# Patient Record
Sex: Male | Born: 1955 | Hispanic: No | Marital: Married | Smoking: Never smoker
Health system: Southern US, Community
[De-identification: ages and names within clinical notes are randomized; demographics above are authoritative.]

## PROBLEM LIST (undated history)

## (undated) DIAGNOSIS — E119 Type 2 diabetes mellitus without complications: Secondary | ICD-10-CM

---

## 2000-01-15 ENCOUNTER — Emergency Department (HOSPITAL_COMMUNITY): Admission: EM | Admit: 2000-01-15 | Discharge: 2000-01-16 | Payer: Self-pay | Admitting: Emergency Medicine

## 2000-01-17 ENCOUNTER — Emergency Department (HOSPITAL_COMMUNITY): Admission: EM | Admit: 2000-01-17 | Discharge: 2000-01-17 | Payer: Self-pay | Admitting: Emergency Medicine

## 2000-01-17 ENCOUNTER — Encounter: Payer: Self-pay | Admitting: Emergency Medicine

## 2000-01-18 ENCOUNTER — Emergency Department (HOSPITAL_COMMUNITY): Admission: EM | Admit: 2000-01-18 | Discharge: 2000-01-18 | Payer: Self-pay | Admitting: Emergency Medicine

## 2000-09-24 ENCOUNTER — Emergency Department (HOSPITAL_COMMUNITY): Admission: EM | Admit: 2000-09-24 | Discharge: 2000-09-24 | Payer: Self-pay | Admitting: Emergency Medicine

## 2001-03-14 ENCOUNTER — Encounter: Payer: Self-pay | Admitting: Emergency Medicine

## 2001-03-14 ENCOUNTER — Emergency Department (HOSPITAL_COMMUNITY): Admission: EM | Admit: 2001-03-14 | Discharge: 2001-03-14 | Payer: Self-pay | Admitting: Emergency Medicine

## 2003-11-23 ENCOUNTER — Ambulatory Visit: Payer: Self-pay | Admitting: Family Medicine

## 2003-12-21 ENCOUNTER — Ambulatory Visit: Payer: Self-pay | Admitting: *Deleted

## 2004-01-04 ENCOUNTER — Ambulatory Visit: Payer: Self-pay | Admitting: Family Medicine

## 2004-04-18 ENCOUNTER — Ambulatory Visit: Payer: Self-pay | Admitting: Family Medicine

## 2004-07-22 ENCOUNTER — Ambulatory Visit: Payer: Self-pay | Admitting: Family Medicine

## 2004-09-23 ENCOUNTER — Ambulatory Visit: Payer: Self-pay | Admitting: Family Medicine

## 2004-11-19 ENCOUNTER — Ambulatory Visit: Payer: Self-pay | Admitting: Family Medicine

## 2004-11-20 ENCOUNTER — Ambulatory Visit: Payer: Self-pay | Admitting: Family Medicine

## 2004-12-20 ENCOUNTER — Ambulatory Visit: Payer: Self-pay | Admitting: Family Medicine

## 2005-03-21 ENCOUNTER — Ambulatory Visit: Payer: Self-pay | Admitting: Family Medicine

## 2005-07-04 ENCOUNTER — Ambulatory Visit: Payer: Self-pay | Admitting: Family Medicine

## 2005-07-25 ENCOUNTER — Ambulatory Visit: Payer: Self-pay | Admitting: Family Medicine

## 2005-09-15 ENCOUNTER — Ambulatory Visit: Payer: Self-pay | Admitting: Family Medicine

## 2005-10-02 ENCOUNTER — Ambulatory Visit (HOSPITAL_COMMUNITY): Admission: RE | Admit: 2005-10-02 | Discharge: 2005-10-02 | Payer: Self-pay | Admitting: Internal Medicine

## 2005-11-24 ENCOUNTER — Ambulatory Visit: Payer: Self-pay | Admitting: Family Medicine

## 2006-03-23 ENCOUNTER — Ambulatory Visit: Payer: Self-pay | Admitting: Family Medicine

## 2006-05-25 ENCOUNTER — Ambulatory Visit: Payer: Self-pay | Admitting: Family Medicine

## 2006-07-13 DIAGNOSIS — R160 Hepatomegaly, not elsewhere classified: Secondary | ICD-10-CM | POA: Insufficient documentation

## 2006-07-13 DIAGNOSIS — E119 Type 2 diabetes mellitus without complications: Secondary | ICD-10-CM

## 2006-08-17 ENCOUNTER — Ambulatory Visit: Payer: Self-pay | Admitting: Internal Medicine

## 2006-09-29 ENCOUNTER — Ambulatory Visit: Payer: Self-pay | Admitting: Internal Medicine

## 2006-09-29 LAB — CONVERTED CEMR LAB
ALT: 126 units/L — ABNORMAL HIGH (ref 0–53)
AST: 63 units/L — ABNORMAL HIGH (ref 0–37)
Albumin: 4.8 g/dL (ref 3.5–5.2)
Alkaline Phosphatase: 100 units/L (ref 39–117)
BUN: 13 mg/dL (ref 6–23)
CO2: 22 meq/L (ref 19–32)
Calcium: 9.5 mg/dL (ref 8.4–10.5)
Chloride: 105 meq/L (ref 96–112)
Cholesterol: 161 mg/dL (ref 0–200)
Creatinine, Ser: 0.71 mg/dL (ref 0.40–1.50)
Glucose, Bld: 211 mg/dL — ABNORMAL HIGH (ref 70–99)
HDL: 33 mg/dL — ABNORMAL LOW (ref >39)
LDL Cholesterol: 101 mg/dL — ABNORMAL HIGH (ref 0–99)
Potassium: 4.1 meq/L (ref 3.5–5.3)
Sodium: 141 meq/L (ref 135–145)
Total Bilirubin: 0.7 mg/dL (ref 0.3–1.2)
Total CHOL/HDL Ratio: 4.9
Total Protein: 7.6 g/dL (ref 6.0–8.3)
Triglycerides: 134 mg/dL (ref <150)
VLDL: 27 mg/dL (ref 0–40)

## 2006-10-21 ENCOUNTER — Encounter (INDEPENDENT_AMBULATORY_CARE_PROVIDER_SITE_OTHER): Payer: Self-pay | Admitting: *Deleted

## 2006-12-14 ENCOUNTER — Ambulatory Visit: Payer: Self-pay | Admitting: Internal Medicine

## 2006-12-14 ENCOUNTER — Encounter (INDEPENDENT_AMBULATORY_CARE_PROVIDER_SITE_OTHER): Payer: Self-pay | Admitting: Family Medicine

## 2006-12-14 LAB — CONVERTED CEMR LAB
AST: 79 units/L — ABNORMAL HIGH (ref 0–37)
Albumin: 4.8 g/dL (ref 3.5–5.2)
Alkaline Phosphatase: 93 units/L (ref 39–117)
BUN: 12 mg/dL (ref 6–23)
Basophils Absolute: 0 10*3/uL (ref 0.0–0.1)
CO2: 23 meq/L (ref 19–32)
Calcium: 9.3 mg/dL (ref 8.4–10.5)
Creatinine, Ser: 0.73 mg/dL (ref 0.40–1.50)
Eosinophils Relative: 4 % (ref 0–5)
HCT: 49.2 % (ref 39.0–52.0)
Hemoglobin: 16.4 g/dL (ref 13.0–17.0)
LDL Cholesterol: 109 mg/dL — ABNORMAL HIGH (ref 0–99)
Lymphocytes Relative: 41 % (ref 12–46)
MCHC: 33.3 g/dL (ref 30.0–36.0)
Monocytes Absolute: 0.4 10*3/uL (ref 0.1–1.0)
RDW: 13.3 % (ref 11.5–15.5)
Total Bilirubin: 0.8 mg/dL (ref 0.3–1.2)
Total Protein: 7.6 g/dL (ref 6.0–8.3)

## 2007-01-08 ENCOUNTER — Ambulatory Visit (HOSPITAL_COMMUNITY): Admission: RE | Admit: 2007-01-08 | Discharge: 2007-01-08 | Payer: Self-pay | Admitting: Cardiology

## 2007-02-08 ENCOUNTER — Ambulatory Visit: Payer: Self-pay | Admitting: Family Medicine

## 2007-02-08 ENCOUNTER — Encounter (INDEPENDENT_AMBULATORY_CARE_PROVIDER_SITE_OTHER): Payer: Self-pay | Admitting: Internal Medicine

## 2007-02-08 LAB — CONVERTED CEMR LAB
ALT: 142 units/L — ABNORMAL HIGH (ref 0–53)
Albumin: 4.6 g/dL (ref 3.5–5.2)
CO2: 21 meq/L (ref 19–32)
Calcium: 9.9 mg/dL (ref 8.4–10.5)
Chloride: 105 meq/L (ref 96–112)
Cholesterol: 123 mg/dL (ref 0–200)
Glucose, Bld: 165 mg/dL — ABNORMAL HIGH (ref 70–99)
Sodium: 140 meq/L (ref 135–145)
Total Protein: 7.4 g/dL (ref 6.0–8.3)

## 2007-05-10 ENCOUNTER — Ambulatory Visit: Payer: Self-pay | Admitting: Internal Medicine

## 2007-05-10 ENCOUNTER — Encounter (INDEPENDENT_AMBULATORY_CARE_PROVIDER_SITE_OTHER): Payer: Self-pay | Admitting: Family Medicine

## 2007-05-10 LAB — CONVERTED CEMR LAB
ALT: 109 units/L — ABNORMAL HIGH (ref 0–53)
AST: 61 units/L — ABNORMAL HIGH (ref 0–37)
Alkaline Phosphatase: 91 units/L (ref 39–117)
Glucose, Bld: 160 mg/dL — ABNORMAL HIGH (ref 70–99)
HCV Ab: NEGATIVE
Hep A Total Ab: POSITIVE — AB
Hep B E Ab: NEGATIVE
Hep B S Ab: NEGATIVE
Sodium: 140 meq/L (ref 135–145)
Total Bilirubin: 0.8 mg/dL (ref 0.3–1.2)
Total Protein: 7.7 g/dL (ref 6.0–8.3)

## 2007-08-26 ENCOUNTER — Ambulatory Visit: Payer: Self-pay | Admitting: Family Medicine

## 2007-09-16 ENCOUNTER — Ambulatory Visit: Payer: Self-pay | Admitting: Internal Medicine

## 2007-09-16 LAB — CONVERTED CEMR LAB
Cholesterol: 122 mg/dL (ref 0–200)
Hep B E Ab: NEGATIVE
Hep B S Ab: NEGATIVE
Microalb, Ur: 1.98 mg/dL — ABNORMAL HIGH (ref 0.00–1.89)
Total CHOL/HDL Ratio: 3.7

## 2007-11-11 ENCOUNTER — Ambulatory Visit: Payer: Self-pay | Admitting: Internal Medicine

## 2008-02-03 ENCOUNTER — Ambulatory Visit: Payer: Self-pay | Admitting: Internal Medicine

## 2008-07-19 ENCOUNTER — Ambulatory Visit: Payer: Self-pay | Admitting: Internal Medicine

## 2008-07-19 ENCOUNTER — Ambulatory Visit: Payer: Self-pay | Admitting: Family Medicine

## 2008-12-22 ENCOUNTER — Encounter (INDEPENDENT_AMBULATORY_CARE_PROVIDER_SITE_OTHER): Payer: Self-pay | Admitting: Adult Health

## 2008-12-22 ENCOUNTER — Ambulatory Visit: Payer: Self-pay | Admitting: Family Medicine

## 2008-12-22 LAB — CONVERTED CEMR LAB
ALT: 174 units/L — ABNORMAL HIGH (ref 0–53)
CO2: 24 meq/L (ref 19–32)
Calcium: 9.8 mg/dL (ref 8.4–10.5)
Chloride: 105 meq/L (ref 96–112)
Cholesterol: 136 mg/dL (ref 0–200)
Creatinine, Ser: 0.72 mg/dL (ref 0.40–1.50)
Microalb, Ur: 1.79 mg/dL (ref 0.00–1.89)
Total Protein: 7.8 g/dL (ref 6.0–8.3)

## 2009-01-19 ENCOUNTER — Encounter (INDEPENDENT_AMBULATORY_CARE_PROVIDER_SITE_OTHER): Payer: Self-pay | Admitting: Adult Health

## 2009-01-19 ENCOUNTER — Ambulatory Visit: Payer: Self-pay | Admitting: Internal Medicine

## 2009-01-19 LAB — CONVERTED CEMR LAB: Hgb A1c MFr Bld: 9.5 % — ABNORMAL HIGH (ref 4.6–6.1)

## 2009-04-20 ENCOUNTER — Encounter (INDEPENDENT_AMBULATORY_CARE_PROVIDER_SITE_OTHER): Payer: Self-pay | Admitting: Adult Health

## 2009-04-20 ENCOUNTER — Ambulatory Visit: Payer: Self-pay | Admitting: Internal Medicine

## 2009-04-20 LAB — CONVERTED CEMR LAB
ALT: 109 units/L — ABNORMAL HIGH (ref 0–53)
AST: 67 units/L — ABNORMAL HIGH (ref 0–37)
Albumin: 4.9 g/dL (ref 3.5–5.2)
Calcium: 9.9 mg/dL (ref 8.4–10.5)
Chloride: 102 meq/L (ref 96–112)
Helicobacter Pylori Antibody-IgG: 5.3 — ABNORMAL HIGH
Potassium: 4.2 meq/L (ref 3.5–5.3)
Sodium: 140 meq/L (ref 135–145)

## 2009-05-28 ENCOUNTER — Ambulatory Visit: Payer: Self-pay | Admitting: Internal Medicine

## 2009-06-15 ENCOUNTER — Ambulatory Visit: Payer: Self-pay | Admitting: Internal Medicine

## 2009-06-25 ENCOUNTER — Ambulatory Visit: Payer: Self-pay | Admitting: Internal Medicine

## 2010-01-18 ENCOUNTER — Encounter (INDEPENDENT_AMBULATORY_CARE_PROVIDER_SITE_OTHER): Payer: Self-pay | Admitting: *Deleted

## 2010-01-18 LAB — CONVERTED CEMR LAB
ALT: 105 units/L — ABNORMAL HIGH (ref 0–53)
AST: 61 units/L — ABNORMAL HIGH (ref 0–37)
CO2: 25 meq/L (ref 19–32)
Calcium: 9.6 mg/dL (ref 8.4–10.5)
Chloride: 105 meq/L (ref 96–112)
Microalb, Ur: 0.5 mg/dL (ref 0.00–1.89)
Sodium: 140 meq/L (ref 135–145)
Total Bilirubin: 0.4 mg/dL (ref 0.3–1.2)
Total Protein: 7.7 g/dL (ref 6.0–8.3)

## 2010-09-27 ENCOUNTER — Inpatient Hospital Stay (INDEPENDENT_AMBULATORY_CARE_PROVIDER_SITE_OTHER)
Admission: RE | Admit: 2010-09-27 | Discharge: 2010-09-27 | Disposition: A | Discharge status: Home / Self Care | Payer: Self-pay | Source: Ambulatory Visit | Attending: Family Medicine | Admitting: Family Medicine

## 2010-09-27 DIAGNOSIS — B029 Zoster without complications: Secondary | ICD-10-CM

## 2010-10-14 ENCOUNTER — Inpatient Hospital Stay (INDEPENDENT_AMBULATORY_CARE_PROVIDER_SITE_OTHER)
Admission: RE | Admit: 2010-10-14 | Discharge: 2010-10-14 | Disposition: A | Discharge status: Home / Self Care | Payer: Self-pay | Source: Ambulatory Visit | Attending: Emergency Medicine | Admitting: Emergency Medicine

## 2010-10-14 DIAGNOSIS — B029 Zoster without complications: Secondary | ICD-10-CM

## 2011-10-12 ENCOUNTER — Emergency Department: Payer: Self-pay | Admitting: *Deleted

## 2011-10-12 LAB — COMPREHENSIVE METABOLIC PANEL
Anion Gap: 8 (ref 7–16)
BUN: 31 mg/dL — ABNORMAL HIGH (ref 7–18)
Bilirubin,Total: 0.6 mg/dL (ref 0.2–1.0)
Creatinine: 1.12 mg/dL (ref 0.60–1.30)
EGFR (African American): >60
EGFR (Non-African Amer.): >60
Glucose: 243 mg/dL — ABNORMAL HIGH (ref 65–99)
SGOT(AST): 140 U/L — ABNORMAL HIGH (ref 15–37)
SGPT (ALT): 169 U/L — ABNORMAL HIGH (ref 12–78)
Sodium: 137 mmol/L (ref 136–145)

## 2011-10-12 LAB — CBC
HGB: 14.9 g/dL (ref 13.0–18.0)
MCHC: 34.2 g/dL (ref 32.0–36.0)
Platelet: 192 10*3/uL (ref 150–440)
RDW: 13.6 % (ref 11.5–14.5)

## 2011-10-13 LAB — URINALYSIS, COMPLETE
Bacteria: NONE SEEN
Bilirubin,UR: NEGATIVE
Blood: NEGATIVE
Ketone: NEGATIVE
Leukocyte Esterase: NEGATIVE
Nitrite: NEGATIVE
Ph: 5 (ref 4.5–8.0)
Protein: 30
Specific Gravity: 1.029 (ref 1.003–1.030)

## 2017-12-20 ENCOUNTER — Other Ambulatory Visit: Payer: Self-pay

## 2017-12-20 ENCOUNTER — Emergency Department
Admission: EM | Admit: 2017-12-20 | Discharge: 2017-12-20 | Disposition: A | Discharge status: Home / Self Care | Payer: Self-pay | Attending: Emergency Medicine | Admitting: Emergency Medicine

## 2017-12-20 ENCOUNTER — Encounter: Payer: Self-pay | Admitting: Intensive Care

## 2017-12-20 DIAGNOSIS — R21 Rash and other nonspecific skin eruption: Secondary | ICD-10-CM | POA: Insufficient documentation

## 2017-12-20 DIAGNOSIS — K047 Periapical abscess without sinus: Secondary | ICD-10-CM | POA: Insufficient documentation

## 2017-12-20 DIAGNOSIS — E119 Type 2 diabetes mellitus without complications: Secondary | ICD-10-CM | POA: Insufficient documentation

## 2017-12-20 HISTORY — DX: Type 2 diabetes mellitus without complications: E11.9

## 2017-12-20 MED ORDER — RANITIDINE HCL 150 MG PO TABS: 150.0000 mg | ORAL_TABLET | Freq: Two times a day (BID) | ORAL | 0 refills | Status: AC

## 2017-12-20 MED ORDER — AMOXICILLIN 500 MG PO CAPS
500.0000 mg | ORAL_CAPSULE | Freq: Once | ORAL | Status: AC
Start: 2017-12-20 — End: 2017-12-20
  Administered 2017-12-20: 500 mg via ORAL
  Filled 2017-12-20: qty 1

## 2017-12-20 MED ORDER — FAMOTIDINE 20 MG PO TABS
20.0000 mg | ORAL_TABLET | Freq: Once | ORAL | Status: AC
  Administered 2017-12-20: 20 mg via ORAL
  Filled 2017-12-20: qty 1

## 2017-12-20 MED ORDER — AMOXICILLIN 500 MG PO CAPS: 500.0000 mg | ORAL_CAPSULE | Freq: Three times a day (TID) | ORAL | 0 refills | Status: AC

## 2017-12-20 NOTE — ED Triage Notes (Signed)
Patient reports rash all over body and Right sided face pain X1 week. Denies fevers at home. Afebrile upon arrival. No open wounds noted

## 2017-12-20 NOTE — Discharge Instructions (Signed)
Your exam is consistent with a dental abscess, that will be treated with antibiotics. You will have the skin rash, that is not present today, treated with medicine for itch relief. Take OTC Benadryl (diphenhydramine) at night for itch relief. Use the surgical soap daily for local skin irritation. Follow-up with your provider as needed.

## 2017-12-20 NOTE — ED Notes (Signed)
Patient c/o blister like rash all over body X 1 week and right sided face pain.

## 2017-12-20 NOTE — ED Provider Notes (Signed)
[facility] Emergency Department Provider Note ____________________________________________  Time seen: 1550  I have reviewed the triage vital signs and the nursing notes.  HISTORY  Chief Complaint  Rash  HPI Michael Huynh is a 62 y.o. male who presents himself to the ED for evaluation of a rash all over his body for the last week.  Patient describes a blisterlike rash, described as "bubbles" over his torso and legs. He is not aware of any allergies, bites, or irritant exposures. He would describe itching with the blisters, he scratches them, they scab over, and  resolves. He has noted the rash, sporadically for the last week.  He also notes some pain to the right side of his face & jaw.  He denies any interim fevers at home.  He denies any known contacts, exposures, or travel.  His medical history is significant for diabetes and hepatomegaly.  Past Medical History:  Diagnosis Date  . Diabetes mellitus without complication [facility])     Patient Active Problem List   Diagnosis Date Noted  . DM 07/13/2006  . 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 No pertinent surgical history.  Prior to Admission medications   Medication Sig Start Date End Date Taking? Authorizing Provider  amoxicillin (AMOXIL) 500 MG capsule Take 1 capsule (500 mg total) by mouth 3 (three) times daily. 12/20/17   Halee Glynn, Charlesetta Ivory, PA-C  ranitidine (ZANTAC) 150 MG tablet Take 1 tablet (150 mg total) by mouth 2 (two) times daily. 12/20/17   Merline Perkin, Charlesetta Ivory, PA-C    Allergies Patient has no known allergies.  History reviewed. No pertinent family history.  Social History Social History   Tobacco Use  . Smoking status: Never Smoker  . Smokeless tobacco: Never Used  Substance Use Topics  . Alcohol use: Not Currently  . Drug use: Never    Review of Systems  Constitutional: Negative for fever. Eyes: Negative for visual changes. ENT: Negative for sore throat.  Reports right jaw/facial pain Cardiovascular: Negative for chest pain. Respiratory: Negative for shortness of breath. Gastrointestinal: Negative for abdominal pain, vomiting and diarrhea. Genitourinary: Negative for dysuria. Musculoskeletal: Negative for back pain. Skin: Positive for rash. Neurological: Negative for headaches, focal weakness or numbness. ____________________________________________  PHYSICAL EXAM:  VITAL SIGNS: ED Triage Vitals  Enc Vitals Group     BP 12/20/17 1441 133/66     Pulse Rate 12/20/17 1441 76     Resp 12/20/17 1441 16     Temp 12/20/17 1441 98.6 F (37 C)     Temp Source 12/20/17 1441 Oral     SpO2 12/20/17 1441 97 %     Weight 12/20/17 1442 157 lb (71.2 kg)     Height 12/20/17 1442 5\' 3"  (1.6 m)     Head Circumference --      Peak Flow --      Pain Score 12/20/17 1442 7     Pain Loc --      Pain Edu? --      Excl. in GC? --     Constitutional: Alert and oriented. Well appearing and in no distress. Head: Normocephalic and atraumatic. Eyes: Conjunctivae are normal. PERRL. Normal extraocular movements Ears: Canals clear. TMs intact bilaterally. Nose: No congestion/rhinorrhea/epistaxis. Mouth/Throat: Mucous membranes are moist.  Uvula is midline and tonsils are flat.  No oropharyngeal lesions are appreciated.  Patient with tenderness localized to the right upper molar.  There is some focal gum swelling and irritation noted at that region.  No brawny sublingual edema is appreciated. Neck: Supple. No thyromegaly. Hematological/Lymphatic/Immunological: No cervical lymphadenopathy. Cardiovascular: Normal rate, regular rhythm. Normal distal pulses. Respiratory: Normal respiratory effort. No wheezes/rales/rhonchi. Skin:  Skin is warm, dry and intact. No rash noted.  Patient with no obvious skin changes on exam.  He points out several areas where he is excoriated the skin primarily to the upper shoulders and trapezius.  There is no blister formation,  vesicles, erythema, edema, scaling or eczema noted.  No warmth or induration is appreciated. ____________________________________________  PROCEDURES  Procedures Famotidine 20 mg PO Amoxicillin 500 mg PO ____________________________________________  INITIAL IMPRESSION / ASSESSMENT AND PLAN / ED COURSE  Patient with ED evaluation of complaint of rash.  Patient's exam is benign at this time as he has no active skin lesions.  There is evidence of some excoriations to what he describes as itchy blisters.  No indication of any secondary skin infection noted.  Patient secondary complaint is right jaw pain which is likely due to some early dental abscess.  We will treat with amoxicillin for dental abscess prevention.  He will also be treated with anti-histamines for itch relief.  A sample of chlorhexidine soap is provided for the patient to use to prevent any secondary infection from scratching.  He will follow-up with primary provider or return to the ED as needed. ____________________________________________  FINAL CLINICAL IMPRESSION(S) / ED DIAGNOSES  Final diagnoses:  Rash and nonspecific skin eruption  Dental abscess      Lissa HoardMenshew, Aarit Kashuba V Bacon, PA-C 12/22/17 1058    Phineas SemenGoodman, Graydon, MD 12/24/17 1131

## 2018-01-07 ENCOUNTER — Encounter: Payer: Self-pay | Admitting: *Deleted

## 2018-01-07 ENCOUNTER — Other Ambulatory Visit: Payer: Self-pay

## 2018-01-07 DIAGNOSIS — E119 Type 2 diabetes mellitus without complications: Secondary | ICD-10-CM | POA: Insufficient documentation

## 2018-01-07 DIAGNOSIS — L03031 Cellulitis of right toe: Secondary | ICD-10-CM | POA: Insufficient documentation

## 2018-01-07 LAB — BASIC METABOLIC PANEL
Anion gap: 7 (ref 5–15)
BUN: 16 mg/dL (ref 8–23)
CHLORIDE: 102 mmol/L (ref 98–111)
CO2: 25 mmol/L (ref 22–32)
CREATININE: 0.79 mg/dL (ref 0.61–1.24)
Calcium: 9.1 mg/dL (ref 8.9–10.3)
GFR calc Af Amer: >60 mL/min (ref >60)
GFR calc non Af Amer: >60 mL/min (ref >60)
Glucose, Bld: 357 mg/dL — ABNORMAL HIGH (ref 70–99)
POTASSIUM: 4.2 mmol/L (ref 3.5–5.1)
Sodium: 134 mmol/L — ABNORMAL LOW (ref 135–145)

## 2018-01-07 LAB — CBC
HEMATOCRIT: 39.6 % (ref 39.0–52.0)
HEMOGLOBIN: 13.5 g/dL (ref 13.0–17.0)
MCH: 30.1 pg (ref 26.0–34.0)
MCHC: 34.1 g/dL (ref 30.0–36.0)
MCV: 88.4 fL (ref 80.0–100.0)
Platelets: 216 10*3/uL (ref 150–400)
RBC: 4.48 MIL/uL (ref 4.22–5.81)
RDW: 12.3 % (ref 11.5–15.5)
WBC: 10.1 10*3/uL (ref 4.0–10.5)
nRBC: 0 % (ref 0.0–0.2)

## 2018-01-07 NOTE — ED Triage Notes (Signed)
Pt is diabetic.  Pt has pain and swelling to right great toe.  Sx began yesterday.  Pt alert

## 2018-01-08 ENCOUNTER — Emergency Department: Payer: Self-pay

## 2018-01-08 ENCOUNTER — Emergency Department
Admission: EM | Admit: 2018-01-08 | Discharge: 2018-01-08 | Disposition: A | Discharge status: Home / Self Care | Payer: Self-pay | Attending: Emergency Medicine | Admitting: Emergency Medicine

## 2018-01-08 DIAGNOSIS — L03031 Cellulitis of right toe: Secondary | ICD-10-CM

## 2018-01-08 MED ORDER — LIDOCAINE-PRILOCAINE 2.5-2.5 % EX CREA
TOPICAL_CREAM | Freq: Once | CUTANEOUS | Status: AC
  Administered 2018-01-08: 1 via TOPICAL
  Filled 2018-01-08: qty 5

## 2018-01-08 MED ORDER — DOXYCYCLINE HYCLATE 100 MG PO TABS
100.0000 mg | ORAL_TABLET | Freq: Once | ORAL | Status: AC
  Administered 2018-01-08: 100 mg via ORAL
  Filled 2018-01-08: qty 1

## 2018-01-08 MED ORDER — DOXYCYCLINE HYCLATE 100 MG PO CAPS: ORAL_CAPSULE | ORAL | 0 refills | Status: AC

## 2018-01-08 NOTE — ED Provider Notes (Signed)
[facility] Emergency Department Provider Note  ____________________________________________   First MD Initiated Contact with Patient 01/08/18 0045     (approximate)  I have reviewed the triage vital signs and the nursing notes.   HISTORY  Chief Complaint Toe Pain    HPI Michael Huynh is a 62 y.o. male with DM on oral medications who presents for evaluation of gradually worsening pain and swelling of his right great toe around the nail over the last 24 hours.  He did not sustain any injury of which he is aware.  He wears boots all day for work but they have not changed recently.  He says that it is red, painful, and swollen and it started at the corner of the nail and is spread throughout the toe.  It does not extend into his foot.  He describes the pain is severe and stepping on it or pushing it makes it worse, nothing in particular makes it better.  It is an aching and sharp pain.  He denies fever/chills, chest pain, shortness of breath, nausea, vomiting, and abdominal pain.   Past Medical History:  Diagnosis Date  . Diabetes mellitus without complication [facility])     Patient Active Problem List   Diagnosis Date Noted  . DM 07/13/2006  . HEPATOMEGALY 07/13/2006    No past surgical history on file.  Prior to Admission medications   Medication Sig Start Date End Date Taking? Authorizing Provider  amoxicillin (AMOXIL) 500 MG capsule Take 1 capsule (500 mg total) by mouth 3 (three) times daily. 12/20/17   Menshew, Charlesetta Ivory, PA-C  doxycycline (VIBRAMYCIN) 100 MG capsule Take 1 capsule (100 mg) by mouth twice daily for 10 days. 01/08/18   Loleta Rose, MD  ranitidine (ZANTAC) 150 MG tablet Take 1 tablet (150 mg total) by mouth 2 (two) times daily. 12/20/17   Menshew, Charlesetta Ivory, PA-C    Allergies Patient has no known allergies.  No family history on file.  Social History Social History   Tobacco Use  . Smoking status: Never Smoker  .  Smokeless tobacco: Never Used  Substance Use Topics  . Alcohol use: Not Currently  . Drug use: Never    Review of Systems Constitutional: No fever/chills Eyes: No visual changes. ENT: No sore throat. Cardiovascular: Denies chest pain. Respiratory: Denies shortness of breath. Gastrointestinal: No abdominal pain.  No nausea, no vomiting.  No diarrhea.  No constipation. Genitourinary: Negative for dysuria. Musculoskeletal: Pain in right great toe as described above.  Negative for neck pain.  Negative for back pain. Integumentary: Negative for rash. Neurological: Negative for headaches, focal weakness or numbness.   ____________________________________________   PHYSICAL EXAM:  VITAL SIGNS: ED Triage Vitals  Enc Vitals Group     BP 01/07/18 2007 130/61     Pulse Rate 01/07/18 2007 77     Resp 01/07/18 2007 20     Temp 01/07/18 2007 99.8 F (37.7 C)     Temp Source 01/07/18 2007 Oral     SpO2 01/07/18 2007 99 %     Weight 01/07/18 2009 71.2 kg (157 lb)     Height 01/07/18 2009 1.6 m (5\' 3" )     Head Circumference --      Peak Flow --      Pain Score 01/07/18 2008 10     Pain Loc --      Pain Edu? --      Excl. in GC? --  Constitutional: Alert and oriented. Well appearing and in no acute distress. Eyes: Conjunctivae are normal.  Head: Atraumatic. Nose: No congestion/rhinnorhea. Mouth/Throat: Mucous membranes are moist. Neck: No stridor.  No meningeal signs.   Cardiovascular: Normal rate, regular rhythm. Good peripheral circulation. Grossly normal heart sounds. Respiratory: Normal respiratory effort.  No retractions. Lungs CTAB. Gastrointestinal: Soft and nontender. No distention.  Musculoskeletal: Erythema, fluctuance, and tenderness all around the base of the right toenail most consistent with a paronychia.  His toenail has some chronic deformity and he has a little bit of serosanguineous discharge around the base of the nail.  The patient has no diabetic foot wound  and the plantar surface of the toe and foot are normal.  There is no spreading cellulitis beyond the base of the toe. Neurologic:  Normal speech and language. No gross focal neurologic deficits are appreciated.  Skin:  Skin is warm, dry and intact. No rash noted. Psychiatric: Mood and affect are normal. Speech and behavior are normal.  ____________________________________________   LABS (all labs ordered are listed, but only abnormal results are displayed)  Labs Reviewed  BASIC METABOLIC PANEL - Abnormal; Notable for the following components:      Result Value   Sodium 134 (*)    Glucose, Bld 357 (*)    All other components within normal limits  AEROBIC/ANAEROBIC CULTURE (SURGICAL/DEEP WOUND)  CBC   ____________________________________________  EKG  No indication for EKG ____________________________________________  RADIOLOGY Marylou MccoyI, Chasyn Cinque, personally viewed and evaluated these images (plain radiographs) as part of my medical decision making, as well as reviewing the written report by the radiologist.  ED MD interpretation: No bony abnormality including no osteomyelitis  Official radiology report(s): Dg Toe Great Right  Result Date: 01/08/2018 CLINICAL DATA:  Pain, swelling.  Diabetic. EXAM: RIGHT GREAT TOE COMPARISON:  None FINDINGS: No acute bony abnormality. Specifically, no fracture, subluxation, or dislocation. Soft tissue swelling in the right great toe. No radiographic changes of osteomyelitis. IMPRESSION: Soft tissue swelling.  No acute bony abnormality. Electronically Signed   By: Charlett NoseKevin  Dover M.D.   On: 01/08/2018 00:49    ____________________________________________   PROCEDURES  Critical Care performed: No   Procedure(s) performed:   Marland Kitchen.Marland Kitchen.Incision and Drainage Date/Time: 01/08/2018 2:41 AM Performed by: Loleta RoseForbach, Brax Walen, MD Authorized by: Loleta RoseForbach, Starlette Thurow, MD   Consent:    Consent obtained:  Verbal   Consent given by:  Patient   Risks discussed:  Bleeding,  infection, incomplete drainage and pain   Alternatives discussed:  Alternative treatment, delayed treatment and observation Location:    Indications for incision and drainage: Paronychia.   Location:  Lower extremity   Lower extremity location:  Toe   Toe location:  R big toe Pre-procedure details:    Skin preparation:  Betadine Anesthesia (see MAR for exact dosages):    Anesthesia method:  Topical application   Topical anesthetic:  EMLA cream Procedure type:    Complexity:  Simple Procedure details:    Incision types:  Single straight   Scalpel blade:  11   Drainage:  Bloody and purulent   Drainage amount:  Copious   Wound treatment:  Wound left open Post-procedure details:    Patient tolerance of procedure:  Tolerated well, no immediate complications     ____________________________________________   INITIAL IMPRESSION / ASSESSMENT AND PLAN / ED COURSE  As part of my medical decision making, I reviewed the following data within the electronic MEDICAL RECORD NUMBER Nursing notes reviewed and incorporated, Labs reviewed , Radiograph  reviewed  and Notes from prior ED visits    Differential diagnosis includes, but is not limited to, paronychia, ingrown toenail, cellulitis, diabetic foot ulcer.  His lab results are within normal limits with no leukocytosis, notable only for hyperglycemia of which she is aware.  Vital signs are stable.  No signs of sepsis.  He has an obvious paronychia of the right great toe and as documented above I drained it with copious bloody purulent discharge.  I stressed to him and his wife the importance of close outpatient follow-up with podiatry, keeping the wound clean and dry, etc.  I gave my usual customary return precautions and provided a course of doxycycline including a coupon from [email or web address].  They understand and agree with the plan.     ____________________________________________  FINAL CLINICAL IMPRESSION(S) / ED DIAGNOSES  Final diagnoses:    Paronychia of great toe, right     MEDICATIONS GIVEN DURING THIS VISIT:  Medications  lidocaine-prilocaine (EMLA) cream (1 application Topical Given 01/08/18 0124)  doxycycline (VIBRA-TABS) tablet 100 mg (100 mg Oral Given 01/08/18 6962)     ED Discharge Orders         Ordered    doxycycline (VIBRAMYCIN) 100 MG capsule     01/08/18 0225           Note:  This document was prepared using Dragon voice recognition software and may include unintentional dictation errors.    Loleta Rose, MD 01/08/18 [id number]

## 2018-01-08 NOTE — ED Notes (Signed)
Dr York CeriseForbach in for I&D of right great toe
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
## 2018-01-08 NOTE — ED Notes (Signed)
Toe clensed thoroughly with NS and large bandaid applied

## 2018-01-08 NOTE — Discharge Instructions (Signed)
Please soak your toe in warm water twice daily.  Keep the wound clean and dry.  Take your antibiotics as instructed for the next 10 days.  It is very important that you follow up with a foot specialist (podiatrist) such as Dr. Alberteen Spindleline or one of his colleagues for a follow up appointment.  Return to the emergency department if you develop new or worsening symptoms that concern you.

## 2018-01-13 LAB — AEROBIC/ANAEROBIC CULTURE W GRAM STAIN (SURGICAL/DEEP WOUND): Special Requests: NORMAL

## 2018-04-07 ENCOUNTER — Emergency Department: Payer: Self-pay

## 2018-04-07 ENCOUNTER — Emergency Department
Admission: EM | Admit: 2018-04-07 | Discharge: 2018-04-07 | Disposition: A | Discharge status: Home / Self Care | Payer: Self-pay | Attending: Student in an Organized Health Care Education/Training Program | Admitting: Student in an Organized Health Care Education/Training Program

## 2018-04-07 ENCOUNTER — Other Ambulatory Visit: Payer: Self-pay

## 2018-04-07 DIAGNOSIS — J069 Acute upper respiratory infection, unspecified: Secondary | ICD-10-CM | POA: Insufficient documentation

## 2018-04-07 DIAGNOSIS — R1013 Epigastric pain: Secondary | ICD-10-CM | POA: Insufficient documentation

## 2018-04-07 DIAGNOSIS — E119 Type 2 diabetes mellitus without complications: Secondary | ICD-10-CM | POA: Insufficient documentation

## 2018-04-07 DIAGNOSIS — J101 Influenza due to other identified influenza virus with other respiratory manifestations: Secondary | ICD-10-CM | POA: Insufficient documentation

## 2018-04-07 LAB — URINALYSIS, COMPLETE (UACMP) WITH MICROSCOPIC
BILIRUBIN URINE: NEGATIVE
Bacteria, UA: NONE SEEN
Glucose, UA: >=500 mg/dL — AB
Ketones, ur: 20 mg/dL — AB
LEUKOCYTE UA: NEGATIVE
Nitrite: NEGATIVE
Protein, ur: 30 mg/dL — AB
Specific Gravity, Urine: 1.02 (ref 1.005–1.030)
Squamous Epithelial / HPF: NONE SEEN (ref 0–5)
pH: 5 (ref 5.0–8.0)

## 2018-04-07 LAB — COMPREHENSIVE METABOLIC PANEL
ALBUMIN: 3.8 g/dL (ref 3.5–5.0)
ALT: 28 U/L (ref 0–44)
AST: 33 U/L (ref 15–41)
Alkaline Phosphatase: 71 U/L (ref 38–126)
Anion gap: 14 (ref 5–15)
BUN: 25 mg/dL — ABNORMAL HIGH (ref 8–23)
CO2: 20 mmol/L — ABNORMAL LOW (ref 22–32)
Calcium: 9 mg/dL (ref 8.9–10.3)
Chloride: 98 mmol/L (ref 98–111)
Creatinine, Ser: 0.96 mg/dL (ref 0.61–1.24)
GFR calc Af Amer: >60 mL/min (ref >60)
GFR calc non Af Amer: >60 mL/min (ref >60)
Glucose, Bld: 277 mg/dL — ABNORMAL HIGH (ref 70–99)
POTASSIUM: 4.1 mmol/L (ref 3.5–5.1)
Sodium: 132 mmol/L — ABNORMAL LOW (ref 135–145)
Total Bilirubin: 1.1 mg/dL (ref 0.3–1.2)
Total Protein: 7.9 g/dL (ref 6.5–8.1)

## 2018-04-07 LAB — CBC
HCT: 37.4 % — ABNORMAL LOW (ref 39.0–52.0)
Hemoglobin: 12.8 g/dL — ABNORMAL LOW (ref 13.0–17.0)
MCH: 29.4 pg (ref 26.0–34.0)
MCHC: 34.2 g/dL (ref 30.0–36.0)
MCV: 86 fL (ref 80.0–100.0)
Platelets: 220 10*3/uL (ref 150–400)
RBC: 4.35 MIL/uL (ref 4.22–5.81)
RDW: 12 % (ref 11.5–15.5)
WBC: 7.8 10*3/uL (ref 4.0–10.5)
nRBC: 0 % (ref 0.0–0.2)

## 2018-04-07 LAB — LIPASE, BLOOD: Lipase: 21 U/L (ref 11–51)

## 2018-04-07 LAB — INFLUENZA PANEL BY PCR (TYPE A & B)
Influenza A By PCR: POSITIVE — AB
Influenza B By PCR: NEGATIVE

## 2018-04-07 MED ORDER — NAPROXEN 500 MG PO TABS: 500.0000 mg | ORAL_TABLET | Freq: Once | ORAL | Status: DC

## 2018-04-07 MED ORDER — IPRATROPIUM-ALBUTEROL 0.5-2.5 (3) MG/3ML IN SOLN
3.0000 mL | Freq: Once | RESPIRATORY_TRACT | Status: AC
  Administered 2018-04-07: 3 mL via RESPIRATORY_TRACT
  Filled 2018-04-07: qty 3

## 2018-04-07 MED ORDER — ALBUTEROL SULFATE HFA 108 (90 BASE) MCG/ACT IN AERS: 2.0000 | INHALATION_SPRAY | Freq: Four times a day (QID) | RESPIRATORY_TRACT | 2 refills | Status: AC | PRN

## 2018-04-07 MED ORDER — OSELTAMIVIR PHOSPHATE 75 MG PO CAPS: 75.0000 mg | ORAL_CAPSULE | Freq: Two times a day (BID) | ORAL | 0 refills | Status: AC

## 2018-04-07 NOTE — ED Notes (Signed)
pulse ox maintains 96% with ambulation

## 2018-04-07 NOTE — ED Notes (Signed)
Tolerated fluids well.

## 2018-04-07 NOTE — ED Triage Notes (Signed)
Pt in with co upper abd pain and cough since Saturday. Denies any n.v.d or dysuria.

## 2018-04-07 NOTE — ED Provider Notes (Signed)
[facility] Emergency Department Provider Note    First MD Initiated Contact with Patient 04/07/18 2112     (approximate)  I have reviewed the triage vital signs and the nursing notes.   HISTORY  Chief Complaint Cough and Abdominal Pain    HPI Michael Huynh is a 63 y.o. male with a history of diabetes presents with chief complaint cough and epigastric discomfort as well as fever and chills since Saturday.  The symptoms came on rapidly.  Does have sick contacts at home.  Has had muscle aches as well as headache.  Denies any history of asthma or COPD.  Is never been diagnosed with bronchitis.  States he was working over the weekend and just felt more fatigued throughout the day.  Denies any diarrhea.  No dysuria.  Recent antibiotics.    Past Medical History:  Diagnosis Date  . Diabetes mellitus without complication (HCC)    No family history on file. No past surgical history on file. Patient Active Problem List   Diagnosis Date Noted  . DM 07/13/2006  . HEPATOMEGALY 07/13/2006      Prior to Admission medications   Medication Sig Start Date End Date Taking? Authorizing Provider  albuterol (PROVENTIL HFA;VENTOLIN HFA) 108 (90 Base) MCG/ACT inhaler Inhale 2 puffs into the lungs every 6 (six) hours as needed for wheezing or shortness of breath. 04/07/18   Willy Eddy, MD  amoxicillin (AMOXIL) 500 MG capsule Take 1 capsule (500 mg total) by mouth 3 (three) times daily. 12/20/17   Menshew, Charlesetta Ivory, PA-C  doxycycline (VIBRAMYCIN) 100 MG capsule Take 1 capsule (100 mg) by mouth twice daily for 10 days. 01/08/18   Loleta Rose, MD  oseltamivir (TAMIFLU) 75 MG capsule Take 1 capsule (75 mg total) by mouth 2 (two) times daily for 5 days. 04/07/18 04/12/18  Willy Eddy, MD  ranitidine (ZANTAC) 150 MG tablet Take 1 tablet (150 mg total) by mouth 2 (two) times daily. 12/20/17   Menshew, Charlesetta Ivory, PA-C    Allergies Patient has no known  allergies.    Social History Social History   Tobacco Use  . Smoking status: Never Smoker  . Smokeless tobacco: Never Used  Substance Use Topics  . Alcohol use: Not Currently  . Drug use: Never    Review of Systems Patient denies headaches, rhinorrhea, blurry vision, numbness, shortness of breath, chest pain, edema, cough, abdominal pain, nausea, vomiting, diarrhea, dysuria, fevers, rashes or hallucinations unless otherwise stated above in HPI. ____________________________________________   PHYSICAL EXAM:  VITAL SIGNS: Vitals:   04/07/18 2017 04/07/18 2242  BP: (!) 144/56 (!) 132/52  Pulse: 93 90  Resp: 20   Temp: 99.3 F (37.4 C) 99 F (37.2 C)  SpO2: 96% 96%    Constitutional: Alert and oriented.  Eyes: Conjunctivae are normal.  Head: Atraumatic. Nose: No congestion/rhinnorhea. Mouth/Throat: Mucous membranes are moist.   Neck: No stridor. Painless ROM.  Cardiovascular: Normal rate, regular rhythm. Grossly normal heart sounds.  Good peripheral circulation. Respiratory: Normal respiratory effort.  No retractions. Lungs with coarse bibasilar breathsounds Gastrointestinal: Soft and nontender. No distention. No abdominal bruits. No CVA tenderness. Genitourinary:  Musculoskeletal: No lower extremity tenderness nor edema.  No joint effusions. Neurologic:  Normal speech and language. No gross focal neurologic deficits are appreciated. No facial droop Skin:  Skin is warm, dry and intact. No rash noted. Psychiatric: Mood and affect are normal. Speech and behavior are normal.  ____________________________________________   LABS (all labs ordered  are listed, but only abnormal results are displayed)  Results for orders placed or performed during the hospital encounter of 04/07/18 (from the past 24 hour(s))  CBC     Status: Abnormal   Collection Time: 04/07/18  8:20 PM  Result Value Ref Range   WBC 7.8 4.0 - 10.5 K/uL   RBC 4.35 4.22 - 5.81 MIL/uL   Hemoglobin 12.8 (L)  13.0 - 17.0 g/dL   HCT 50.9 (L) 32.6 - 71.2 %   MCV 86.0 80.0 - 100.0 fL   MCH 29.4 26.0 - 34.0 pg   MCHC 34.2 30.0 - 36.0 g/dL   RDW 45.8 09.9 - 83.3 %   Platelets 220 150 - 400 K/uL   nRBC 0.0 0.0 - 0.2 %  Comprehensive metabolic panel     Status: Abnormal   Collection Time: 04/07/18  8:20 PM  Result Value Ref Range   Sodium 132 (L) 135 - 145 mmol/L   Potassium 4.1 3.5 - 5.1 mmol/L   Chloride 98 98 - 111 mmol/L   CO2 20 (L) 22 - 32 mmol/L   Glucose, Bld 277 (H) 70 - 99 mg/dL   BUN 25 (H) 8 - 23 mg/dL   Creatinine, Ser 8.25 0.61 - 1.24 mg/dL   Calcium 9.0 8.9 - 05.3 mg/dL   Total Protein 7.9 6.5 - 8.1 g/dL   Albumin 3.8 3.5 - 5.0 g/dL   AST 33 15 - 41 U/L   ALT 28 0 - 44 U/L   Alkaline Phosphatase 71 38 - 126 U/L   Total Bilirubin 1.1 0.3 - 1.2 mg/dL   GFR calc non Af Amer >60 >60 mL/min   GFR calc Af Amer >60 >60 mL/min   Anion gap 14 5 - 15  Lipase, blood     Status: None   Collection Time: 04/07/18  8:20 PM  Result Value Ref Range   Lipase 21 11 - 51 U/L  Urinalysis, Complete w Microscopic     Status: Abnormal   Collection Time: 04/07/18  8:20 PM  Result Value Ref Range   Color, Urine YELLOW (A) YELLOW   APPearance CLEAR (A) CLEAR   Specific Gravity, Urine 1.020 1.005 - 1.030   pH 5.0 5.0 - 8.0   Glucose, UA >=500 (A) NEGATIVE mg/dL   Hgb urine dipstick MODERATE (A) NEGATIVE   Bilirubin Urine NEGATIVE NEGATIVE   Ketones, ur 20 (A) NEGATIVE mg/dL   Protein, ur 30 (A) NEGATIVE mg/dL   Nitrite NEGATIVE NEGATIVE   Leukocytes,Ua NEGATIVE NEGATIVE   RBC / HPF 0-5 0 - 5 RBC/hpf   WBC, UA 0-5 0 - 5 WBC/hpf   Bacteria, UA NONE SEEN NONE SEEN   Squamous Epithelial / LPF NONE SEEN 0 - 5   Mucus PRESENT   Influenza panel by PCR (type A & B)     Status: Abnormal   Collection Time: 04/07/18  9:49 PM  Result Value Ref Range   Influenza A By PCR POSITIVE (A) NEGATIVE   Influenza B By PCR NEGATIVE NEGATIVE    ____________________________________________ ____________________________________________  RADIOLOGY  I personally reviewed all radiographic images ordered to evaluate for the above acute complaints and reviewed radiology reports and findings.  These findings were personally discussed with the patient.  Please see medical record for radiology report.  ____________________________________________   PROCEDURES  Procedure(s) performed:  Procedures    Critical Care performed: no ____________________________________________   INITIAL IMPRESSION / ASSESSMENT AND PLAN / ED COURSE  Pertinent labs & imaging results  that were available during my care of the patient were reviewed by me and considered in my medical decision making (see chart for details).   DDX: Pneumonia, flu, bronchitis, gastritis, cholecystitis, cholelithiasis  Michael Huynh is a 62 y.o. who presents to the ED with symptoms as described above.  Patient with low-grade temperature.  Does have wheezing on exam is describing flulike illness.  Or does show mild hyperglycemia and some dehydration but his hemodynamics are otherwise stable.  Chest x-ray shows evidence of bronchitic changes.  Will give nebulizers.  Will encourage fluids if he is unable to keep up with this we will give IV fluids.  Clinical Course as of Apr 07 2250  Wed Apr 07, 2018  2233 Patient is flu positive.  Did have some improvement after nebulizer treatment.  He is able to ambulate around the ER without any hypoxia or respiratory distress or tachycardia.  There is no focal consolidation will treat for flu and nebulizer treatment.  Will give anti-medics.  Discussed need for follow-up with primary care physician and discuss signs and symptoms for which the patient should return immediately to the hospital.   [PR]    Clinical Course User Index [PR] Willy Eddy, MD     As part of my medical decision making, I reviewed the following data within the  electronic MEDICAL RECORD NUMBER Nursing notes reviewed and incorporated, Labs reviewed, notes from prior ED visits and [place] Controlled Substance Database   ____________________________________________   FINAL CLINICAL IMPRESSION(S) / ED DIAGNOSES  Final diagnoses:  Influenza A  Upper respiratory tract infection, unspecified type      NEW MEDICATIONS STARTED DURING THIS VISIT:  New Prescriptions   ALBUTEROL (PROVENTIL HFA;VENTOLIN HFA) 108 (90 BASE) MCG/ACT INHALER    Inhale 2 puffs into the lungs every 6 (six) hours as needed for wheezing or shortness of breath.   OSELTAMIVIR (TAMIFLU) 75 MG CAPSULE    Take 1 capsule (75 mg total) by mouth 2 (two) times daily for 5 days.     Note:  This document was prepared using Dragon voice recognition software and may include unintentional dictation errors.    Willy Eddy, MD 04/07/18 2253

## 2019-01-10 ENCOUNTER — Other Ambulatory Visit: Payer: Self-pay

## 2019-01-10 DIAGNOSIS — Z20822 Contact with and (suspected) exposure to covid-19: Secondary | ICD-10-CM

## 2019-01-11 LAB — NOVEL CORONAVIRUS, NAA: SARS-CoV-2, NAA: NOT DETECTED

## 2019-01-30 ENCOUNTER — Encounter: Payer: Self-pay | Admitting: Emergency Medicine

## 2019-01-30 ENCOUNTER — Emergency Department
Admission: EM | Admit: 2019-01-30 | Discharge: 2019-01-30 | Disposition: A | Discharge status: Home / Self Care | Payer: Self-pay | Attending: Emergency Medicine | Admitting: Emergency Medicine

## 2019-01-30 ENCOUNTER — Other Ambulatory Visit: Payer: Self-pay

## 2019-01-30 ENCOUNTER — Emergency Department: Payer: Self-pay

## 2019-01-30 DIAGNOSIS — E119 Type 2 diabetes mellitus without complications: Secondary | ICD-10-CM | POA: Insufficient documentation

## 2019-01-30 DIAGNOSIS — W458XXD Other foreign body or object entering through skin, subsequent encounter: Secondary | ICD-10-CM | POA: Insufficient documentation

## 2019-01-30 DIAGNOSIS — L089 Local infection of the skin and subcutaneous tissue, unspecified: Secondary | ICD-10-CM | POA: Insufficient documentation

## 2019-01-30 DIAGNOSIS — Z79899 Other long term (current) drug therapy: Secondary | ICD-10-CM | POA: Insufficient documentation

## 2019-01-30 DIAGNOSIS — S61031D Puncture wound without foreign body of right thumb without damage to nail, subsequent encounter: Secondary | ICD-10-CM | POA: Insufficient documentation

## 2019-01-30 LAB — CBC WITH DIFFERENTIAL/PLATELET
Abs Immature Granulocytes: 0.02 10*3/uL (ref 0.00–0.07)
Basophils Absolute: 0 10*3/uL (ref 0.0–0.1)
Basophils Relative: 1 %
Eosinophils Absolute: 0.2 10*3/uL (ref 0.0–0.5)
Eosinophils Relative: 3 %
HCT: 39.8 % (ref 39.0–52.0)
Hemoglobin: 14.2 g/dL (ref 13.0–17.0)
Immature Granulocytes: 0 %
Lymphocytes Relative: 20 %
Lymphs Abs: 1.5 10*3/uL (ref 0.7–4.0)
MCH: 29.4 pg (ref 26.0–34.0)
MCHC: 35.7 g/dL (ref 30.0–36.0)
MCV: 82.4 fL (ref 80.0–100.0)
Monocytes Absolute: 0.5 10*3/uL (ref 0.1–1.0)
Monocytes Relative: 6 %
Neutro Abs: 5.5 10*3/uL (ref 1.7–7.7)
Neutrophils Relative %: 70 %
Platelets: 305 10*3/uL (ref 150–400)
RBC: 4.83 MIL/uL (ref 4.22–5.81)
RDW: 12.1 % (ref 11.5–15.5)
WBC: 7.8 10*3/uL (ref 4.0–10.5)
nRBC: 0 % (ref 0.0–0.2)

## 2019-01-30 LAB — BASIC METABOLIC PANEL
Anion gap: 10 (ref 5–15)
BUN: 15 mg/dL (ref 8–23)
CO2: 26 mmol/L (ref 22–32)
Calcium: 9.3 mg/dL (ref 8.9–10.3)
Chloride: 100 mmol/L (ref 98–111)
Creatinine, Ser: 0.99 mg/dL (ref 0.61–1.24)
GFR calc Af Amer: >60 mL/min (ref >60)
GFR calc non Af Amer: >60 mL/min (ref >60)
Glucose, Bld: 361 mg/dL — ABNORMAL HIGH (ref 70–99)
Potassium: 4.4 mmol/L (ref 3.5–5.1)
Sodium: 136 mmol/L (ref 135–145)

## 2019-01-30 MED ORDER — SODIUM CHLORIDE 0.9 % IV BOLUS
1000.0000 mL | Freq: Once | INTRAVENOUS | Status: AC
  Administered 2019-01-30: 1000 mL via INTRAVENOUS

## 2019-01-30 MED ORDER — PIPERACILLIN-TAZOBACTAM 3.375 G IVPB 30 MIN
3.3750 g | Freq: Once | INTRAVENOUS | Status: AC
  Administered 2019-01-30: 3.375 g via INTRAVENOUS
  Filled 2019-01-30: qty 50

## 2019-01-30 MED ORDER — CEPHALEXIN 500 MG PO CAPS: 500.0000 mg | ORAL_CAPSULE | Freq: Four times a day (QID) | ORAL | 0 refills | Status: AC

## 2019-01-30 NOTE — ED Triage Notes (Signed)
Patient presents to the ED with a swollen right thumb.  Patient states a piece of wood "got in" his right thumb 2 weeks ago.  Patient states he removed the wood and was given a "jar of solution to clean it up and a shot for infection" by his doctor in [place] at the time of the injury.  Patient has been pouring hydrogen peroxide on his finger 3 times a day and squeezing puss out of thumb.  A small amount of puss is seen on the thumb at this time, thumb appears swollen and reddend.  Patient is a diabetic.  No obvious redness to hand or arm, no streaking noted.  Patient is afebrile.

## 2019-01-30 NOTE — ED Provider Notes (Signed)
[facility] Emergency Department Provider Note  ____________________________________________   First MD Initiated Contact with Patient 01/30/19 1227     (approximate)  I have reviewed the triage vital signs and the nursing notes.   HISTORY  Chief Complaint Hand Problem    HPI Michael Huynh is a 63 y.o. male presents emergency department with right thumb pain and swelling.  Patient states he had a splinter in his thumb which she removed.  Was seen at his regular doctors and given a shot for infection and his Tdap is up-to-date.  He denies any fever or chills.    Past Medical History:  Diagnosis Date  . Diabetes mellitus without complication [facility])     Patient Active Problem List   Diagnosis Date Noted  . DM 07/13/2006  . 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 No pertinent surgical history.  Prior to Admission medications   Medication Sig Start Date End Date Taking? Authorizing Provider  albuterol (PROVENTIL HFA;VENTOLIN HFA) 108 (90 Base) MCG/ACT inhaler Inhale 2 puffs into the lungs every 6 (six) hours as needed for wheezing or shortness of breath. 04/07/18   Willy Eddy, MD  amoxicillin (AMOXIL) 500 MG capsule Take 1 capsule (500 mg total) by mouth 3 (three) times daily. 12/20/17   Menshew, Charlesetta Ivory, PA-C  cephALEXin (KEFLEX) 500 MG capsule Take 1 capsule (500 mg total) by mouth 4 (four) times daily for 10 days. 01/30/19 02/09/19  Jacole Capley, Roselyn Bering, PA-C  doxycycline (VIBRAMYCIN) 100 MG capsule Take 1 capsule (100 mg) by mouth twice daily for 10 days. 01/08/18   Loleta Rose, MD  ranitidine (ZANTAC) 150 MG tablet Take 1 tablet (150 mg total) by mouth 2 (two) times daily. 12/20/17   Menshew, Charlesetta Ivory, PA-C    Allergies Patient has no known allergies.  History reviewed. No pertinent family history.  Social History Social History   Tobacco Use  . Smoking status: Never Smoker  . Smokeless tobacco: Never Used  Substance  Use Topics  . Alcohol use: Not Currently  . Drug use: Never    Review of Systems  Constitutional: No fever/chills Eyes: No visual changes. ENT: No sore throat. Respiratory: Denies cough Genitourinary: Negative for dysuria. Musculoskeletal: Negative for back pain.  Right thumb pain Skin: Negative for rash.    ____________________________________________   PHYSICAL EXAM:  VITAL SIGNS: ED Triage Vitals  Enc Vitals Group     BP 01/30/19 1215 137/69     Pulse Rate 01/30/19 1215 71     Resp 01/30/19 1215 16     Temp 01/30/19 1215 98.9 F (37.2 C)     Temp Source 01/30/19 1215 Oral     SpO2 01/30/19 1215 100 %     Weight 01/30/19 1216 154 lb (69.9 kg)     Height 01/30/19 1216 5' (1.524 m)     Head Circumference --      Peak Flow --      Pain Score 01/30/19 1215 0     Pain Loc --      Pain Edu? --      Excl. in GC? --     Constitutional: Alert and oriented. Well appearing and in no acute distress. Eyes: Conjunctivae are normal.  Head: Atraumatic. Nose: No congestion/rhinnorhea. Mouth/Throat: Mucous membranes are moist.   Neck:  supple no lymphadenopathy noted Cardiovascular: Normal rate, regular rhythm. Respiratory: Normal respiratory effort.  No retractions,  GU: deferred Musculoskeletal: FROM all extremities, warm and well perfused, right thumb  has full range of motion, no pain is reproduced with range of motion, distal aspect has a large amount of swelling noted and a scab on the volar surface. Neurologic:  Normal speech and language.  Skin:  Skin is warm, dry and intact. No rash noted. Psychiatric: Mood and affect are normal. Speech and behavior are normal.  ____________________________________________   LABS (all labs ordered are listed, but only abnormal results are displayed)  Labs Reviewed  BASIC METABOLIC PANEL - Abnormal; Notable for the following components:      Result Value   Glucose, Bld 361 (*)    All other components within normal limits    AEROBIC CULTURE (SUPERFICIAL SPECIMEN)  CBC WITH DIFFERENTIAL/PLATELET   ____________________________________________   ____________________________________________  RADIOLOGY  X-ray of the right thumb is negative for osteomyelitis or foreign body  ____________________________________________   PROCEDURES  Procedure(s) performed: Saline lock, normal saline 1 L IV, Zosyn IV   Procedures    ____________________________________________   INITIAL IMPRESSION / ASSESSMENT AND PLAN / ED COURSE  Pertinent labs & imaging results that were available during my care of the patient were reviewed by me and considered in my medical decision making (see chart for details).   Patient is a 63 year old male presents emergency department with concerns of a right thumb infection.  See HPI  Physical exam shows the volar surface of the right thumb to be soft and fluctuant with a scab.  However he does have full range of motion of the thumb and it does not reproduce pain.  CBC is normal.  Metabolic panel has increased glucose of 376.  However the patient admits to not taking his medication today.  Explained all the findings to the patient.  The x-ray did not show osteomyelitis or foreign body.  He was given Zosyn IV and normal saline 1 L IV.  A wound culture was obtained.  He is to return to the emergency department tomorrow for a recheck.  He was given a prescription for Keflex 4 times daily.  He is to soak in warm water with Epson salts.  He was instructed to stop using hydrogen peroxide.  He states he understands will comply.  The interpreter was present to convey all of the information.    Michael Huynh was evaluated in Emergency Department on 01/30/2019 for the symptoms described in the history of present illness. He was evaluated in the context of the global COVID-19 pandemic, which necessitated consideration that the patient might be at risk for infection with the SARS-CoV-2 virus that  causes COVID-19. Institutional protocols and algorithms that pertain to the evaluation of patients at risk for COVID-19 are in a state of rapid change based on information released by regulatory bodies including the CDC and federal and state organizations. These policies and algorithms were followed during the patient's care in the ED.   As part of my medical decision making, I reviewed the following data within the [place] notes reviewed and incorporated, Interpreter needed, Labs reviewed CBC is normal, metabolic panel has increased glucose, Old chart reviewed, Radiograph reviewed x-ray is negative for foreign body or osteomyelitis, Notes from prior ED visits and [place] Controlled Substance Database  ____________________________________________   FINAL CLINICAL IMPRESSION(S) / ED DIAGNOSES  Final diagnoses:  Finger infection      NEW MEDICATIONS STARTED DURING THIS VISIT:  New Prescriptions   CEPHALEXIN (KEFLEX) 500 MG CAPSULE    Take 1 capsule (500 mg total) by mouth 4 (four) times daily  for 10 days.     Note:  This document was prepared using Dragon voice recognition software and may include unintentional dictation errors.    Faythe GheeFisher, Aleia Larocca W, PA-C 01/30/19 1503    Minna AntisPaduchowski, Kevin, MD 01/30/19 [phone]

## 2019-01-30 NOTE — ED Notes (Signed)
Pt had part of stick embedded in right thumb 2 weeks ago, pt presents with swollen right thumb with large brown scab at end. Thumb is red and dry. Pt states that pus has drained from it recently.

## 2019-01-31 ENCOUNTER — Emergency Department
Admission: EM | Admit: 2019-01-31 | Discharge: 2019-01-31 | Disposition: A | Discharge status: Home / Self Care | Payer: Self-pay | Attending: Student in an Organized Health Care Education/Training Program | Admitting: Student in an Organized Health Care Education/Training Program

## 2019-01-31 ENCOUNTER — Other Ambulatory Visit: Payer: Self-pay

## 2019-01-31 ENCOUNTER — Encounter: Payer: Self-pay | Admitting: Emergency Medicine

## 2019-01-31 DIAGNOSIS — Z4800 Encounter for change or removal of nonsurgical wound dressing: Secondary | ICD-10-CM | POA: Insufficient documentation

## 2019-01-31 DIAGNOSIS — Z5189 Encounter for other specified aftercare: Secondary | ICD-10-CM

## 2019-01-31 DIAGNOSIS — Z79899 Other long term (current) drug therapy: Secondary | ICD-10-CM | POA: Insufficient documentation

## 2019-01-31 DIAGNOSIS — E119 Type 2 diabetes mellitus without complications: Secondary | ICD-10-CM | POA: Insufficient documentation

## 2019-01-31 MED ORDER — SULFAMETHOXAZOLE-TRIMETHOPRIM 800-160 MG PO TABS: 1.0000 | ORAL_TABLET | Freq: Two times a day (BID) | ORAL | 0 refills | Status: DC

## 2019-01-31 NOTE — ED Provider Notes (Signed)
[facility] Emergency Department Provider Note  ____________________________________________  Time seen: Approximately 5:18 PM  I have reviewed the triage vital signs and the nursing notes.   HISTORY  Chief Complaint Wound Check    HPI Michael Huynh is a 63 y.o. male who presents the emergency department for wound recheck.  Patient was seen yesterday for a wound to the right thumb that it got infected after splinter removal.  Patient was advised to follow-up today for reevaluation.  Patient denies any complication.  He is started his antibiotics as directed.  Patient reports that the area appears to be compared to yesterday.  No systemic complaints.  Patient is here for wound check only.        Past Medical History:  Diagnosis Date  . Diabetes mellitus without complication [facility])     Patient Active Problem List   Diagnosis Date Noted  . DM 07/13/2006  . 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 No pertinent surgical history.  Prior to Admission medications   Medication Sig Start Date End Date Taking? Authorizing Provider  albuterol (PROVENTIL HFA;VENTOLIN HFA) 108 (90 Base) MCG/ACT inhaler Inhale 2 puffs into the lungs every 6 (six) hours as needed for wheezing or shortness of breath. 04/07/18   Merlyn Lot, MD  amoxicillin (AMOXIL) 500 MG capsule Take 1 capsule (500 mg total) by mouth 3 (three) times daily. 12/20/17   Menshew, Dannielle Karvonen, PA-C  cephALEXin (KEFLEX) 500 MG capsule Take 1 capsule (500 mg total) by mouth 4 (four) times daily for 10 days. 01/30/19 02/09/19  Fisher, Linden Dolin, PA-C  doxycycline (VIBRAMYCIN) 100 MG capsule Take 1 capsule (100 mg) by mouth twice daily for 10 days. 01/08/18   Hinda Kehr, MD  ranitidine (ZANTAC) 150 MG tablet Take 1 tablet (150 mg total) by mouth 2 (two) times daily. 12/20/17   Menshew, Dannielle Karvonen, PA-C  sulfamethoxazole-trimethoprim (BACTRIM DS) 800-160 MG tablet Take 1 tablet by mouth 2 (two)  times daily. 01/31/19   Nuri Larmer, Charline Bills, PA-C    Allergies Patient has no known allergies.  No family history on file.  Social History Social History   Tobacco Use  . Smoking status: Never Smoker  . Smokeless tobacco: Never Used  Substance Use Topics  . Alcohol use: Not Currently  . Drug use: Never     Review of Systems  Constitutional: No fever/chills Eyes: No visual changes. No discharge ENT: No upper respiratory complaints. Cardiovascular: no chest pain. Respiratory: no cough. No SOB. Gastrointestinal: No abdominal pain.  No nausea, no vomiting.  Musculoskeletal: Here for wound recheck of the left thumb Skin: Negative for rash, abrasions, lacerations, ecchymosis. Neurological: Negative for headaches, focal weakness or numbness. 10-point ROS otherwise negative.  ____________________________________________   PHYSICAL EXAM:  VITAL SIGNS: ED Triage Vitals  Enc Vitals Group     BP 01/31/19 1647 (!) 136/58     Pulse Rate 01/31/19 1647 69     Resp 01/31/19 1647 17     Temp 01/31/19 1647 98.4 F (36.9 C)     Temp Source 01/31/19 1647 Oral     SpO2 01/31/19 1647 97 %     Weight 01/31/19 1648 155 lb (70.3 kg)     Height 01/31/19 1648 5\' 4"  (1.626 m)     Head Circumference --      Peak Flow --      Pain Score 01/31/19 1711 0     Pain Loc --      Pain Edu? --  Excl. in GC? --      Constitutional: Alert and oriented. Well appearing and in no acute distress. Eyes: Conjunctivae are normal. PERRL. EOMI. Head: Atraumatic. ENT:      Ears:       Nose: No congestion/rhinnorhea.      Mouth/Throat: Mucous membranes are moist.  Neck: No stridor.    Cardiovascular: Normal rate, regular rhythm. Normal S1 and S2.  Good peripheral circulation. Respiratory: Normal respiratory effort without tachypnea or retractions. Lungs CTAB. Good air entry to the bases with no decreased or absent breath sounds. Musculoskeletal: Full range of motion to all extremities. No gross  deformities appreciated.  Visualization of the left thumb reveals open wound.  No purulent drainage.  No evidence of infectious tenosynovitis.  Patient is able to extend and flex the thumb at this time.  Good sensation to the distal aspect.  Good capillary refill.  No streaking up the left upper extremity.  Compared to previous note, area appears to be slightly improved. Neurologic:  Normal speech and language. No gross focal neurologic deficits are appreciated.  Skin:  Skin is warm, dry and intact. No rash noted. Psychiatric: Mood and affect are normal. Speech and behavior are normal. Patient exhibits appropriate insight and judgement.   ____________________________________________   LABS (all labs ordered are listed, but only abnormal results are displayed)  Labs Reviewed - No data to display ____________________________________________  EKG   ____________________________________________  RADIOLOGY   No results found.  ____________________________________________    PROCEDURES  Procedure(s) performed:    Procedures    Medications - No data to display   ____________________________________________   INITIAL IMPRESSION / ASSESSMENT AND PLAN / ED COURSE  Pertinent labs & imaging results that were available during my care of the patient were reviewed by me and considered in my medical decision making (see chart for details).  Review of the [place] CSRS was performed in accordance of the [facility] prior to dispensing any controlled drugs.           Patient's diagnosis is consistent with wound recheck.  Patient presented to the emergency department complaining of infection to the thumb.  Patient was seen yesterday given IV antibiotics and advised to follow-up.  Compared to previous note, it does appear that this may be slightly improved.  Patient reports improvement in his pain, and reports improvement in the overall appearance of the wound.  Patient has been placed on Keflex,  I felt that this would likely require MRSA coverage and place the patient on Bactrim as well.  Advised the patient to take both antibiotics at this time.  Patient verbalizes understanding of same..  Return precautions are discussed with the patient.  Follow-up primary care.  Patient is given ED precautions to return to the ED for any worsening or new symptoms.     ____________________________________________  FINAL CLINICAL IMPRESSION(S) / ED DIAGNOSES  Final diagnoses:  Visit for wound check      NEW MEDICATIONS STARTED DURING THIS VISIT:  ED Discharge Orders         Ordered    sulfamethoxazole-trimethoprim (BACTRIM DS) 800-160 MG tablet  2 times daily     01/31/19 1719              This chart was dictated using voice recognition software/Dragon. Despite best efforts to proofread, errors can occur which can change the meaning. Any change was purely unintentional.    Racheal Patches, PA-C 01/31/19 2335    Willy Eddy, MD 01/31/19 2337

## 2019-01-31 NOTE — ED Triage Notes (Signed)
Returns for wound check.

## 2019-01-31 NOTE — ED Notes (Signed)
AAOx3.  Skin warm and dry.  NAD 

## 2019-02-01 LAB — AEROBIC CULTURE W GRAM STAIN (SUPERFICIAL SPECIMEN)

## 2019-06-05 IMAGING — DX DG TOE GREAT 2+V*R*
3 series · 3 of 3 positions shown · non-contrast
Comparison: None

CLINICAL DATA: Pain, swelling.  Diabetic.

EXAM:
RIGHT GREAT TOE

[toe ap]
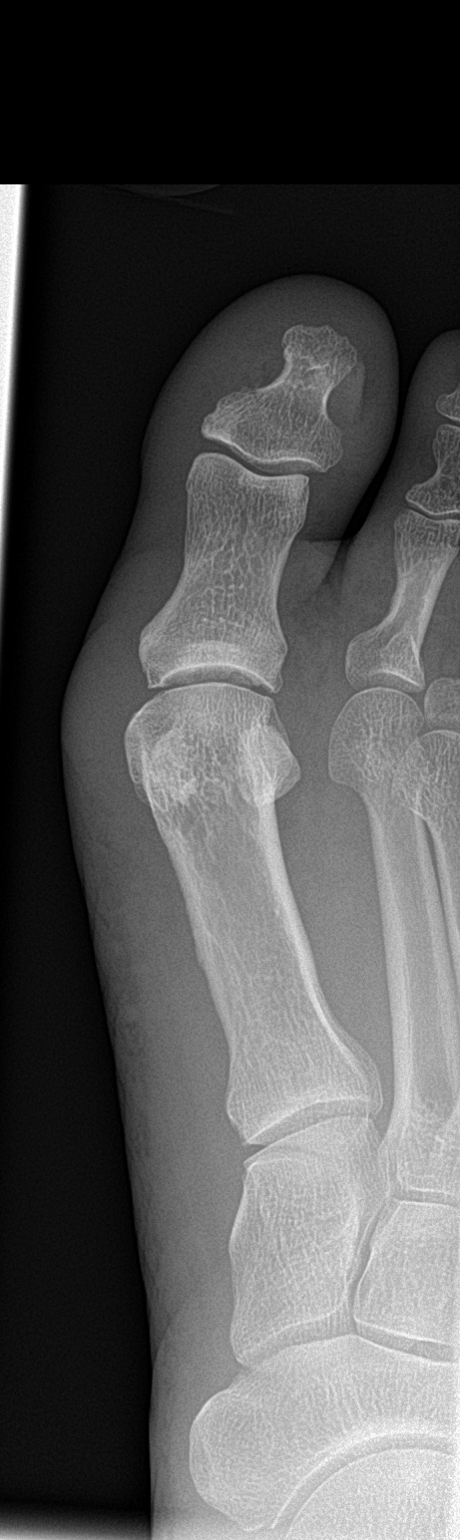

[toe obl]
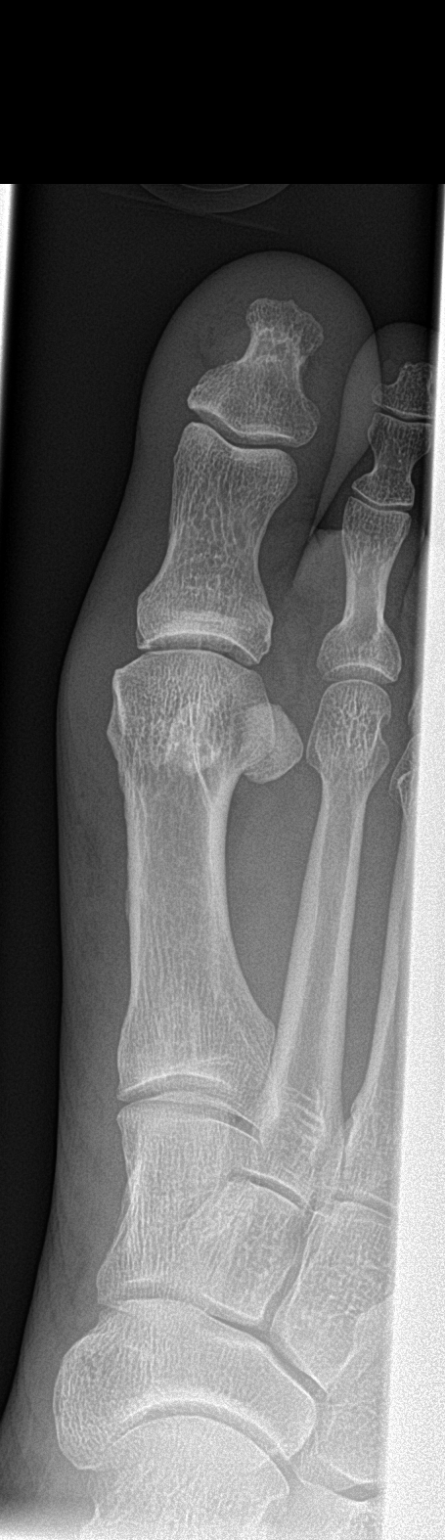

[toe lat]
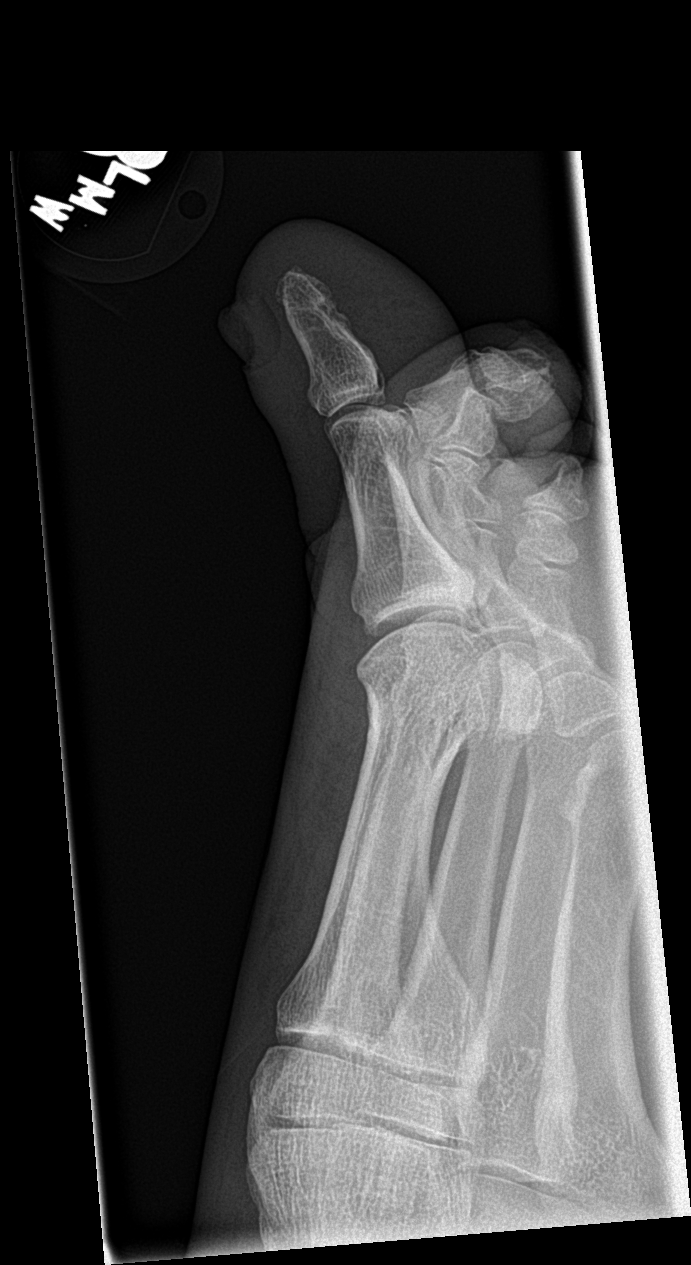

[3 of 3 positions shown; findings below may reference images not displayed]

FINDINGS: No acute bony abnormality. Specifically, no fracture, subluxation,
or dislocation. Soft tissue swelling in the right great toe. No
radiographic changes of osteomyelitis.
IMPRESSION: Soft tissue swelling.  No acute bony abnormality.

## 2019-09-15 ENCOUNTER — Other Ambulatory Visit: Payer: Self-pay

## 2019-09-15 ENCOUNTER — Encounter: Payer: Self-pay | Admitting: *Deleted

## 2019-09-15 ENCOUNTER — Emergency Department
Admission: EM | Admit: 2019-09-15 | Discharge: 2019-09-15 | Disposition: A | Discharge status: Home / Self Care | Payer: Self-pay | Attending: Emergency Medicine | Admitting: Emergency Medicine

## 2019-09-15 DIAGNOSIS — L03115 Cellulitis of right lower limb: Secondary | ICD-10-CM | POA: Insufficient documentation

## 2019-09-15 DIAGNOSIS — E119 Type 2 diabetes mellitus without complications: Secondary | ICD-10-CM | POA: Insufficient documentation

## 2019-09-15 DIAGNOSIS — Z792 Long term (current) use of antibiotics: Secondary | ICD-10-CM | POA: Insufficient documentation

## 2019-09-15 MED ORDER — CLINDAMYCIN PHOSPHATE 600 MG/4ML IJ SOLN
600.0000 mg | Freq: Once | INTRAMUSCULAR | Status: AC
  Administered 2019-09-15: 600 mg via INTRAMUSCULAR
  Filled 2019-09-15 (×4): qty 4

## 2019-09-15 MED ORDER — SULFAMETHOXAZOLE-TRIMETHOPRIM 800-160 MG PO TABS: 1.0000 | ORAL_TABLET | Freq: Two times a day (BID) | ORAL | 0 refills | Status: AC

## 2019-09-15 MED ORDER — CLINDAMYCIN HCL 300 MG PO CAPS: 300.0000 mg | ORAL_CAPSULE | Freq: Four times a day (QID) | ORAL | 0 refills | Status: AC

## 2019-09-15 NOTE — ED Triage Notes (Signed)
Pt is here for 4 days of worsening redness and pain on 2 spots on his right leg.  Pt has redness and warmth above right knee that looks like its coming to a head.  Pt denies any fever or chills with this.

## 2019-09-15 NOTE — ED Provider Notes (Signed)
[facility] Emergency Department Provider Note  ____________________________________________  Time seen: Approximately 9:21 PM  I have reviewed the triage vital signs and the nursing notes.   HISTORY  Chief Complaint Recurrent Skin Infections    HPI Michael Huynh is a 64 y.o. male who presents the emergency department with 2 erythematous lesions to the right leg.  Patient has 1 in the anterior proximal thigh, the second is proximal to the knee.  2 days of symptoms.  Worsening pain but no joint pain or hip pain.  No fevers or chills.  No history of recurrent skin infections.  No medications prior to arrival.         Past Medical History:  Diagnosis Date  . Diabetes mellitus without complication [facility])     Patient Active Problem List   Diagnosis Date Noted  . DM 07/13/2006  . 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 No pertinent surgical history.  Prior to Admission medications   Medication Sig Start Date End Date Taking? Authorizing Provider  albuterol (PROVENTIL HFA;VENTOLIN HFA) 108 (90 Base) MCG/ACT inhaler Inhale 2 puffs into the lungs every 6 (six) hours as needed for wheezing or shortness of breath. 04/07/18   Willy Eddy, MD  amoxicillin (AMOXIL) 500 MG capsule Take 1 capsule (500 mg total) by mouth 3 (three) times daily. 12/20/17   Menshew, Charlesetta Ivory, PA-C  clindamycin (CLEOCIN) 300 MG capsule Take 1 capsule (300 mg total) by mouth 4 (four) times daily. 09/15/19   Bricen Victory, Delorise Royals, PA-C  doxycycline (VIBRAMYCIN) 100 MG capsule Take 1 capsule (100 mg) by mouth twice daily for 10 days. 01/08/18   Loleta Rose, MD  ranitidine (ZANTAC) 150 MG tablet Take 1 tablet (150 mg total) by mouth 2 (two) times daily. 12/20/17   Menshew, Charlesetta Ivory, PA-C  sulfamethoxazole-trimethoprim (BACTRIM DS) 800-160 MG tablet Take 1 tablet by mouth 2 (two) times daily. 09/15/19   Micalah Cabezas, Delorise Royals, PA-C    Allergies Patient has no known  allergies.  No family history on file.  Social History Social History   Tobacco Use  . Smoking status: Never Smoker  . Smokeless tobacco: Never Used  Substance Use Topics  . Alcohol use: Not Currently  . Drug use: Never     Review of Systems  Constitutional: No fever/chills Eyes: No visual changes. No discharge ENT: No upper respiratory complaints. Cardiovascular: no chest pain. Respiratory: no cough. No SOB. Gastrointestinal: No abdominal pain.  No nausea, no vomiting.  No diarrhea.  No constipation. Musculoskeletal: Negative for musculoskeletal pain. Skin: 2 erythematous skin lesions to the right leg Neurological: Negative for headaches, focal weakness or numbness. 10-point ROS otherwise negative.  ____________________________________________   PHYSICAL EXAM:  VITAL SIGNS: ED Triage Vitals  Enc Vitals Group     BP 09/15/19 2035 136/61     Pulse Rate 09/15/19 2035 81     Resp 09/15/19 2035 16     Temp 09/15/19 2035 98.7 F (37.1 C)     Temp Source 09/15/19 2035 Oral     SpO2 09/15/19 2035 97 %     Weight 09/15/19 2036 154 lb (69.9 kg)     Height --      Head Circumference --      Peak Flow --      Pain Score 09/15/19 2036 7     Pain Loc --      Pain Edu? --      Excl. in GC? --  Constitutional: Alert and oriented. Well appearing and in no acute distress. Eyes: Conjunctivae are normal. PERRL. EOMI. Head: Atraumatic. ENT:      Ears:       Nose: No congestion/rhinnorhea.      Mouth/Throat: Mucous membranes are moist.  Neck: No stridor.    Cardiovascular: Normal rate, regular rhythm. Normal S1 and S2.  Good peripheral circulation. Respiratory: Normal respiratory effort without tachypnea or retractions. Lungs CTAB. Good air entry to the bases with no decreased or absent breath sounds. Musculoskeletal: Full range of motion to all extremities. No gross deformities appreciated. Neurologic:  Normal speech and language. No gross focal neurologic deficits  are appreciated.  Skin:  Skin is warm, dry and intact. No rash noted.  2 erythematous lesions noted to the right leg.  First lesion is in the proximal anterior thigh.  Measures approximately 6 cm in diameter.  Firm to palpation with no fluctuance.  Mildly tender to palpation.  No streaking.  Second lesion is just proximal to the knee.  There is no erythema, edema, or ballottement in the knee itself.  Full range of motion to the knee.  Lesion is along the anterior thigh again just proximal to the knee.  1 centralized pustule identified but there is no fluctuance or induration.  During exam pustule is ruptured, scant drainage is expressed with no underlying fluctuance or purulent drainage.  Measures approximately 6 cm in diameter.  No streaking. Psychiatric: Mood and affect are normal. Speech and behavior are normal. Patient exhibits appropriate insight and judgement.   ____________________________________________   LABS (all labs ordered are listed, but only abnormal results are displayed)  Labs Reviewed - No data to display ____________________________________________  EKG   ____________________________________________  RADIOLOGY   No results found.  ____________________________________________    PROCEDURES  Procedure(s) performed:    Procedures    Medications  clindamycin (CLEOCIN) injection 600 mg (has no administration in time range)     ____________________________________________   INITIAL IMPRESSION / ASSESSMENT AND PLAN / ED COURSE  Pertinent labs & imaging results that were available during my care of the patient were reviewed by me and considered in my medical decision making (see chart for details).  Review of the [place] CSRS was performed in accordance of the [facility] prior to dispensing any controlled drugs.           Patient's diagnosis is consistent with 2 areas of cellulitis to the right leg.  Patient presented to emergency department with findings  consistent with cellulitis.  No evidence of abscess on physical exam.  Patient has 1 area of cellulitis near the right knee but there is no erythema, edema or joint pain in the right knee.  No evidence of septic arthritis.  No evidence of abscess requiring incision and drainage.  Patient is given clindamycin injection here in the emergency department and will be discharged with dual antibiotic coverage with Bactrim and clindamycin.  Return precautions are discussed at length with the patient to return for any worsening symptoms specifically any symptoms in the joint..  Otherwise follow-up primary care.  Patient is given ED precautions to return to the ED for any worsening or new symptoms.     ____________________________________________  FINAL CLINICAL IMPRESSION(S) / ED DIAGNOSES  Final diagnoses:  Cellulitis of right lower extremity      NEW MEDICATIONS STARTED DURING THIS VISIT:  ED Discharge Orders         Ordered    clindamycin (CLEOCIN) 300 MG capsule  4  times daily     Discontinue  Reprint     09/15/19 2145    sulfamethoxazole-trimethoprim (BACTRIM DS) 800-160 MG tablet  2 times daily     Discontinue  Reprint     09/15/19 2145              This chart was dictated using voice recognition software/Dragon. Despite best efforts to proofread, errors can occur which can change the meaning. Any change was purely unintentional.    Racheal Patches, PA-C 09/15/19 2211    Phineas Semen, MD 09/15/19 2302

## 2019-09-15 NOTE — ED Notes (Signed)
Pharmacy called about medication, told it was being tubed to main ED. Not able to locate at this time

## 2019-11-16 ENCOUNTER — Emergency Department: Payer: Self-pay

## 2019-11-16 ENCOUNTER — Other Ambulatory Visit: Payer: Self-pay

## 2019-11-16 ENCOUNTER — Emergency Department
Admission: EM | Admit: 2019-11-16 | Discharge: 2019-11-16 | Disposition: A | Discharge status: Home / Self Care | Payer: Self-pay | Attending: Emergency Medicine | Admitting: Emergency Medicine

## 2019-11-16 DIAGNOSIS — M5442 Lumbago with sciatica, left side: Secondary | ICD-10-CM | POA: Insufficient documentation

## 2019-11-16 DIAGNOSIS — E119 Type 2 diabetes mellitus without complications: Secondary | ICD-10-CM | POA: Insufficient documentation

## 2019-11-16 DIAGNOSIS — M5432 Sciatica, left side: Secondary | ICD-10-CM

## 2019-11-16 DIAGNOSIS — M79662 Pain in left lower leg: Secondary | ICD-10-CM | POA: Insufficient documentation

## 2019-11-16 MED ORDER — KETOROLAC TROMETHAMINE 60 MG/2ML IM SOLN
30.0000 mg | Freq: Once | INTRAMUSCULAR | Status: AC
  Administered 2019-11-16: 30 mg via INTRAMUSCULAR
  Filled 2019-11-16: qty 2

## 2019-11-16 MED ORDER — TIZANIDINE HCL 4 MG PO TABS: 4.0000 mg | ORAL_TABLET | Freq: Three times a day (TID) | ORAL | 0 refills | Status: AC

## 2019-11-16 MED ORDER — MELOXICAM 15 MG PO TABS: 15.0000 mg | ORAL_TABLET | Freq: Every day | ORAL | 0 refills | Status: AC

## 2019-11-16 NOTE — ED Notes (Addendum)
See triage note, pt reports right leg pain from right hip to bottom of foot that started 2 weeks.  No swelling noted States was seen by PCP for problem and prescribed gabapentin with no improvement.  Denies injury.   Interpreter maritza used for assessment

## 2019-11-16 NOTE — ED Provider Notes (Signed)
[facility] Emergency Department Provider Note  ____________________________________________   First MD Initiated Contact with Patient 11/16/19 1550     (approximate)  I have reviewed the triage vital signs and the nursing notes.   HISTORY  Chief Complaint Leg Pain   HPI Michael Huynh is a 64 y.o. male who presents to the emergency department for evaluation of left leg pain.  He is seen with the assistance of an interpreter.  The patient states that he began having pain that begins in the left buttocks and radiates down the posterior aspect of the left leg into the foot and has been present for the last few weeks.  He saw his primary care doctor who placed him on gabapentin but he has not noticed any improvement.  He works Loss adjuster, chartered with [employer] and notices pain is worse with heavy lifting and with repetitive bending.  He denies any known acute injury that began around the time of his symptoms.  He denies any back pain, saddle anesthesia, loss of bowel or bladder or fevers.  He does have some intermittent numbness and tingling of the anterior aspect of the left lower leg but denies any appreciated weakness.         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 No pertinent surgical history.  Prior to Admission medications   Medication Sig Start Date End Date Taking? Authorizing Provider  albuterol (PROVENTIL HFA;VENTOLIN HFA) 108 (90 Base) MCG/ACT inhaler Inhale 2 puffs into the lungs every 6 (six) hours as needed for wheezing or shortness of breath. 04/07/18   Willy Eddy, MD  amoxicillin (AMOXIL) 500 MG capsule Take 1 capsule (500 mg total) by mouth 3 (three) times daily. 12/20/17   Menshew, Charlesetta Ivory, PA-C  clindamycin (CLEOCIN) 300 MG capsule Take 1 capsule (300 mg total)  by mouth 4 (four) times daily. 09/15/19   Cuthriell, Delorise Royals, PA-C  doxycycline (VIBRAMYCIN) 100 MG capsule Take 1 capsule (100 mg) by mouth twice daily for 10 days. 01/08/18   Loleta Rose, MD  meloxicam (MOBIC) 15 MG tablet Take 1 tablet (15 mg total) by mouth daily. 11/16/19 12/16/19  Lucy Chris, PA  ranitidine (ZANTAC) 150 MG tablet Take 1 tablet (150 mg total) by mouth 2 (two) times daily. 12/20/17   Menshew, Charlesetta Ivory, PA-C  sulfamethoxazole-trimethoprim (BACTRIM DS) 800-160 MG tablet Take 1 tablet by mouth 2 (two) times daily. 09/15/19   Cuthriell, Delorise Royals, PA-C  tiZANidine (ZANAFLEX) 4 MG tablet Take 1 tablet (4 mg total) by mouth 3 (three) times daily for 10 days. 11/16/19 11/26/19  Lucy Chris, PA    Allergies Patient has no known allergies.  History reviewed. No pertinent family history.  Social History Social History   Tobacco Use   Smoking status: Never Smoker   Smokeless tobacco: Never Used  Substance Use Topics   Alcohol use: Not Currently   Drug use: Never    Review of Systems  Constitutional: No fever/chills Eyes: No visual changes. ENT: No sore throat. Cardiovascular: Denies chest pain. Respiratory: Denies shortness of breath. Gastrointestinal: No abdominal pain.  No nausea, no vomiting.  No diarrhea.  No constipation. Genitourinary: Negative for dysuria. Musculoskeletal: + Left leg pain, negative for back pain. Skin: Negative for rash. Neurological: Negative for headaches, focal weakness or  numbness.   ____________________________________________   PHYSICAL EXAM:  VITAL SIGNS: ED Triage Vitals [11/16/19 1536]  Enc Vitals Group     BP 121/60     Pulse Rate 82     Resp 18     Temp 98.9 F (37.2 C)     Temp Source Oral     SpO2 98 %     Weight 137 lb (62.1 kg)     Height 5\' 4"  (1.626 m)     Head Circumference      Peak Flow      Pain Score 8     Pain Loc      Pain Edu?      Excl. in GC?     Constitutional:  Alert and oriented. Well appearing and in no acute distress. Eyes: Conjunctivae are normal.  Head: Atraumatic. Nose: No congestion/rhinnorhea. Mouth/Throat: Mucous membranes are moist.  Oropharynx non-erythematous. Neck: No stridor.   Cardiovascular: Normal rate, regular rhythm. Grossly normal heart sounds.  Good peripheral circulation. Respiratory: Normal respiratory effort.  No retractions. Lungs CTAB. Gastrointestinal: Soft and nontender. No distention. No abdominal bruits. No CVA tenderness. Musculoskeletal: There is tenderness to palpation on the left PSIS, superior gluteal muscles.  He has a positive straight leg raise.  Patient has 5/5 strength of the bilateral legs in ankle plantarflexion, dorsiflexion, knee flexion, knee extension, hip flexion.  Normal DTRs. Neurologic:  Normal speech and language. No gross focal neurologic deficits are appreciated. No gait instability. Skin:  Skin is warm, dry and intact. No rash noted. Psychiatric: Mood and affect are normal. Speech and behavior are normal.  ____________________________________________  RADIOLOGY I, , personally viewed and evaluated these images (plain radiographs) as part of my medical decision making, as well as reviewing the written report by the radiologist.  ED provider interpretation: Arthritic changes noted with no lumbar compression fractures or spondylolisthesis  Official radiology report(s): DG Lumbar Spine 2-3 Views  Result Date: 11/16/2019 CLINICAL DATA:  65 year old male with sciatica. EXAM: LUMBAR SPINE - 2-3 VIEW COMPARISON:  None. FINDINGS: Five lumbar type vertebra. There is no acute fracture or subluxation of the lumbar spine. Mild multilevel degenerative changes with mild chronic compression deformities involving L3-L5 and spurring. The visualized posterior elements are intact. Large amount of stool noted throughout the colon. IMPRESSION: No acute/traumatic lumbar spine pathology. Electronically  Signed   By: 76 M.D.   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  There are no red flag symptoms of saddle anesthesia, loss of bowel or bladder or fever.  On physical exam, patient has tenderness in superior gluteal region as well as a positive straight leg raise, however exam is reassuring and that he has no focal weakness present and DTRs are within normal limits.  X-ray demonstrates no acute fracture finding but does demonstrate arthritic changes with compression and spurring noted.  Constellation of the patient's history as well as physical exam findings are most suggestive of left-sided sciatica.  Given that he has failed to improve with gabapentin, will give the patient a shot of Toradol today and place the  patient on a short course of anti-inflammatories and muscle relaxers.  He will use these in addition to Tylenol.  He was advised he cannot drive or operate heavy machinery with the muscle relaxer.  Patient was advised that should he not improve he should follow-up with his P CP, and he will return to the emergency department for any worsening.  He is amenable with this plan and is stable at this time for outpatient therapy.      ____________________________________________   FINAL CLINICAL IMPRESSION(S) / ED DIAGNOSES  Final diagnoses:  Sciatica of left side     ED Discharge Orders         Ordered    meloxicam (MOBIC) 15 MG tablet  Daily        11/16/19 1740    tiZANidine (ZANAFLEX) 4 MG tablet  3 times daily        11/16/19 1740          *Please note:   Michael Huynh was evaluated in Emergency Department on 11/16/2019 for the symptoms described in the history of present illness. He was evaluated in the context of the global COVID-19 pandemic, which necessitated consideration that the patient might be at risk for infection with the SARS-CoV-2 virus that causes COVID-19. Institutional protocols and algorithms that pertain to the evaluation of patients at risk for COVID-19 are in a state of rapid change based on information released by regulatory bodies including the CDC and federal and state organizations. These policies and algorithms were followed during the patient's care in the ED.  Some ED evaluations and interventions may be delayed as a result of limited staffing during and the pandemic.*   Note:  This document was prepared using Dragon voice recognition software and may include unintentional dictation errors.    Lucy Chris, PA 11/16/19 [place]    Shaune Pollack, MD 11/22/19 [phone]

## 2019-11-16 NOTE — ED Notes (Signed)
In person interpreter used for discharge instructions, denies questions/concerns

## 2019-11-16 NOTE — ED Triage Notes (Signed)
Pt comes POV with left sided hip and calf pain for about 2 weeks. Pt ambulatory to triage. Video interpreter used.

## 2020-06-26 IMAGING — DX DG FINGER THUMB 2+V*R*
3 series · 3 of 3 positions shown · non-contrast
Comparison: None.

CLINICAL DATA: 63-year-old who had Beng Teck Islim his RIGHT
thumb approximately 2 weeks ago which she was able to remove.
Persistent pain and swelling. Initial encounter.

EXAM:
RIGHT THUMB 2+V

[finger ap]
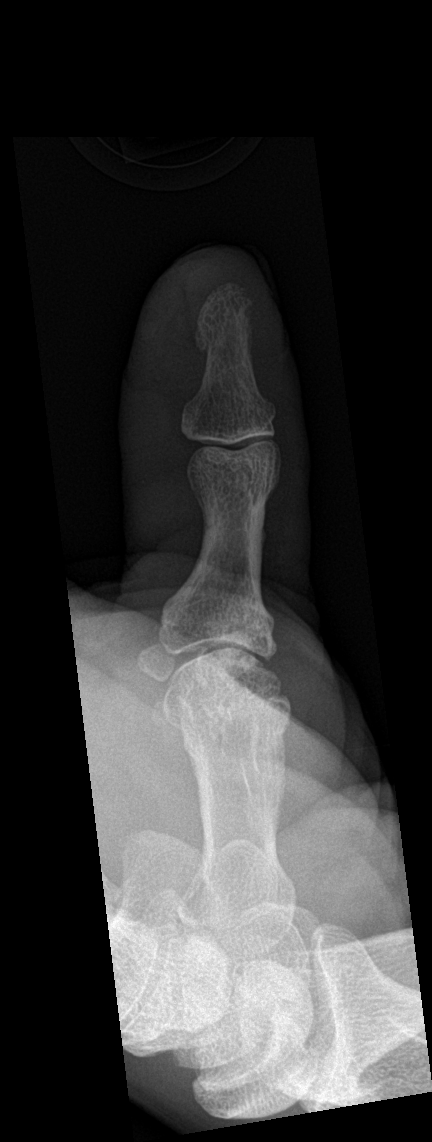

[finger obl]
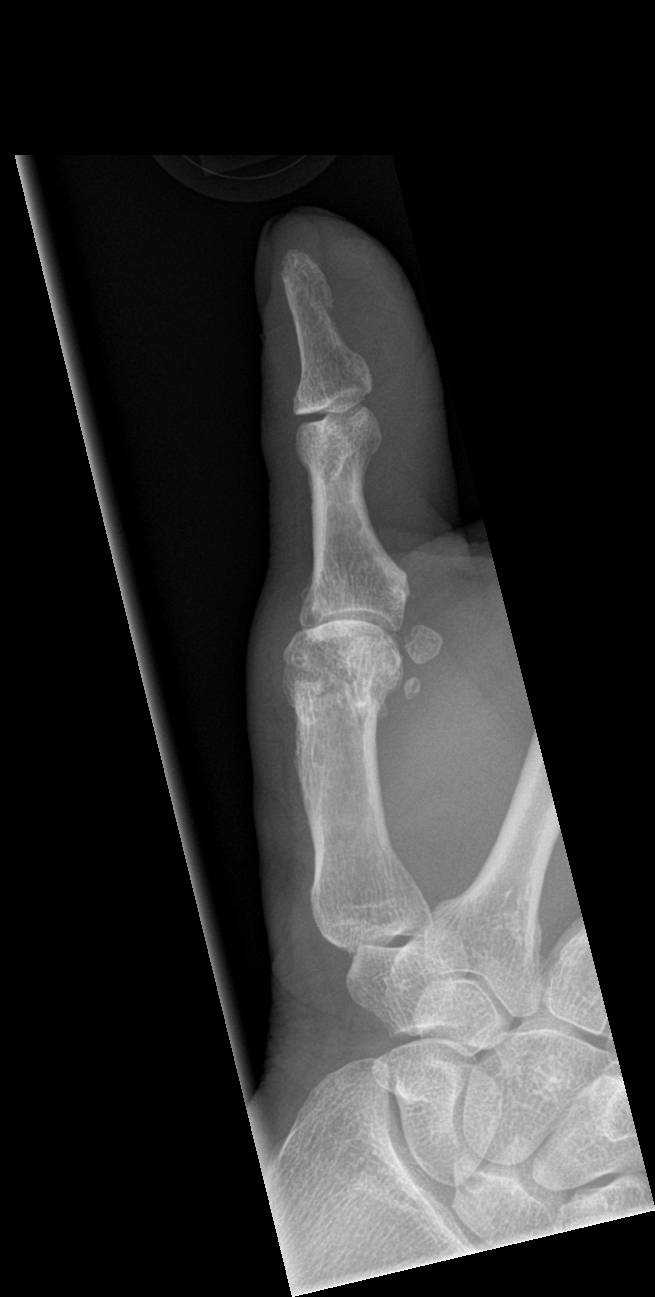

[finger lat]
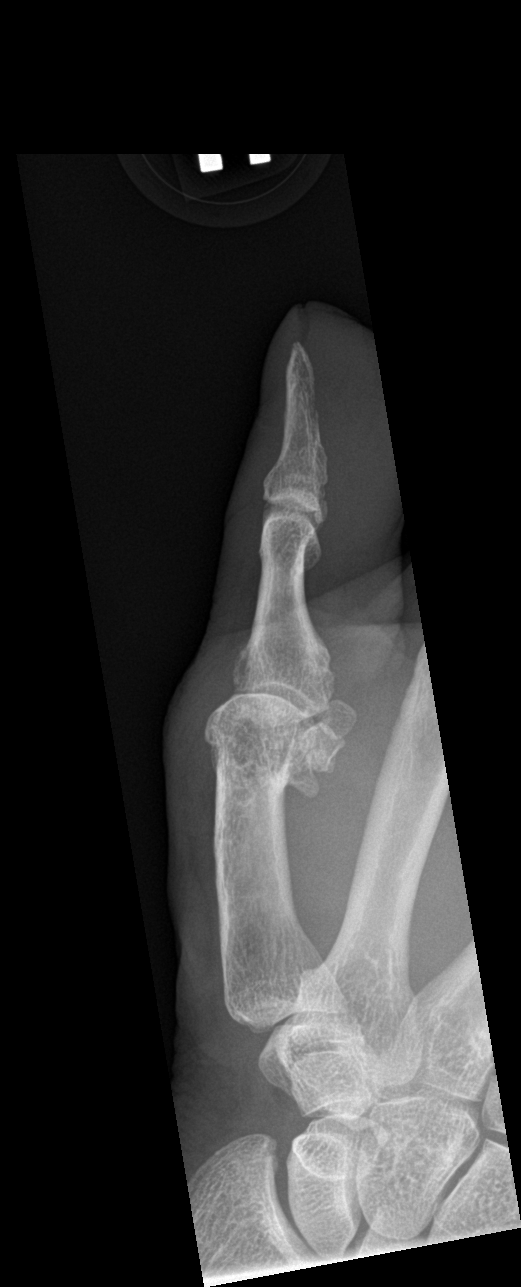

[3 of 3 positions shown; findings below may reference images not displayed]

FINDINGS: Mild diffuse soft tissue swelling. No opaque foreign body in the
soft tissues. No soft tissue gas to confirm infection. No
radiographic evidence of osteomyelitis. Mild narrowing of the first
MCP joint. Well-preserved the IP joint space.
IMPRESSION: 1. No opaque foreign body in the soft tissues.
2. No acute osseous abnormality.
3. Mild osteoarthritis involving the first MCP joint.

## 2020-07-22 ENCOUNTER — Emergency Department
Admission: EM | Admit: 2020-07-22 | Discharge: 2020-07-22 | Disposition: A | Discharge status: Home / Self Care | Payer: Self-pay | Attending: Emergency Medicine | Admitting: Emergency Medicine

## 2020-07-22 ENCOUNTER — Other Ambulatory Visit: Payer: Self-pay

## 2020-07-22 ENCOUNTER — Encounter: Payer: Self-pay | Admitting: Emergency Medicine

## 2020-07-22 DIAGNOSIS — E119 Type 2 diabetes mellitus without complications: Secondary | ICD-10-CM | POA: Insufficient documentation

## 2020-07-22 DIAGNOSIS — M5416 Radiculopathy, lumbar region: Secondary | ICD-10-CM | POA: Insufficient documentation

## 2020-07-22 MED ORDER — MELOXICAM 15 MG PO TABS: 15.0000 mg | ORAL_TABLET | Freq: Every day | ORAL | 2 refills | Status: AC

## 2020-07-22 MED ORDER — KETOROLAC TROMETHAMINE 30 MG/ML IJ SOLN
30.0000 mg | Freq: Once | INTRAMUSCULAR | Status: AC
  Administered 2020-07-22: 19:00:00 30 mg via INTRAMUSCULAR
  Filled 2020-07-22: qty 1

## 2020-07-22 MED ORDER — METHOCARBAMOL 500 MG PO TABS: 500.0000 mg | ORAL_TABLET | Freq: Three times a day (TID) | ORAL | 0 refills | Status: AC | PRN

## 2020-07-22 NOTE — Discharge Instructions (Addendum)
Take meloxicam once daily for pain and inflammation. Take Robaxin before bed.

## 2020-07-22 NOTE — ED Triage Notes (Signed)
Pt reports that he has had left foot pain that radiates up to his back and then into his groin. Denies N/V/D. Denies any injury

## 2020-07-22 NOTE — ED Provider Notes (Addendum)
[facility]-EMERGENCY DEPARTMENT  ____________________________________________  Time seen: Approximately 7:14 PM  I have reviewed the triage vital signs and the nursing notes.   HISTORY  Chief Complaint Foot Pain and Back Pain   Historian Patient     HPI Michael Huynh is a 65 y.o. male with a history of diabetes, presents to the emergency department with low back pain with left lower extremity radiculopathy.  Patient denies bowel or bladder incontinence or saddle anesthesia.  He reports that he has been able to ambulate but with some discomfort.  No dysuria, hematuria or increased urinary frequency.  No falls or mechanisms of trauma.  Denies experiencing similar symptoms in the past.   Past Medical History:  Diagnosis Date  . Diabetes mellitus without complication (HCC)      Immunizations up to date:  Yes.     Past Medical History:  Diagnosis Date  . Diabetes mellitus without complication [facility])     Patient Active Problem List   Diagnosis Date Noted  . DM 07/13/2006  . 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 No pertinent surgical history.  Prior to Admission medications   Medication Sig Start Date End Date Taking? Authorizing Provider  meloxicam (MOBIC) 15 MG tablet Take 1 tablet (15 mg total) by mouth daily for 7 days. Take once daily for seven days. 07/22/20 07/29/20 Yes Pia Mau M, PA-C  methocarbamol (ROBAXIN) 500 MG tablet Take 1 tablet (500 mg total) by mouth every 8 (eight) hours as needed for up to 5 days. 07/22/20 07/27/20 Yes Pia Mau M, PA-C  albuterol (PROVENTIL HFA;VENTOLIN HFA) 108 (90 Base) MCG/ACT inhaler Inhale 2 puffs into the lungs every 6 (six) hours as needed for wheezing or shortness of breath. 04/07/18   Willy Eddy, MD  amoxicillin (AMOXIL) 500 MG capsule Take 1 capsule (500 mg total) by mouth 3 (three) times daily. 12/20/17   Menshew, Charlesetta Ivory, PA-C  clindamycin (CLEOCIN) 300 MG capsule Take 1 capsule (300 mg total) by  mouth 4 (four) times daily. 09/15/19   Cuthriell, Delorise Royals, PA-C  doxycycline (VIBRAMYCIN) 100 MG capsule Take 1 capsule (100 mg) by mouth twice daily for 10 days. 01/08/18   Loleta Rose, MD  ranitidine (ZANTAC) 150 MG tablet Take 1 tablet (150 mg total) by mouth 2 (two) times daily. 12/20/17   Menshew, Charlesetta Ivory, PA-C  sulfamethoxazole-trimethoprim (BACTRIM DS) 800-160 MG tablet Take 1 tablet by mouth 2 (two) times daily. 09/15/19   Cuthriell, Delorise Royals, PA-C    Allergies Patient has no known allergies.  No family history on file.  Social History Social History   Tobacco Use  . Smoking status: Never  . Smokeless tobacco: Never  Substance Use Topics  . Alcohol use: Not Currently  . Drug use: Never     Review of Systems  Constitutional: No fever/chills Eyes:  No discharge ENT: No upper respiratory complaints. Respiratory: no cough. No SOB/ use of accessory muscles to breath Gastrointestinal:   No nausea, no vomiting.  No diarrhea.  No constipation. Musculoskeletal: Patient has low back pain.  Skin: Negative for rash, abrasions, lacerations, ecchymosis.    ____________________________________________   PHYSICAL EXAM:  VITAL SIGNS: ED Triage Vitals  Enc Vitals Group     BP 07/22/20 1802 140/71     Pulse Rate 07/22/20 1802 67     Resp 07/22/20 1802 18     Temp 07/22/20 1802 98.6 F (37 C)     Temp Source 07/22/20 1802 Oral  SpO2 07/22/20 1802 99 %     Weight 07/22/20 1803 136 lb 11 oz (62 kg)     Height 07/22/20 1803 5\' 4"  (1.626 m)     Head Circumference --      Peak Flow --      Pain Score 07/22/20 1803 8     Pain Loc --      Pain Edu? --      Excl. in GC? --      Constitutional: Alert and oriented. Well appearing and in no acute distress. Eyes: Conjunctivae are normal. PERRL. EOMI. Head: Atraumatic. ENT: Cardiovascular: Normal rate, regular rhythm. Normal S1 and S2.  Good peripheral circulation. Respiratory: Normal respiratory effort without  tachypnea or retractions. Lungs CTAB. Good air entry to the bases with no decreased or absent breath sounds Gastrointestinal: Bowel sounds x 4 quadrants. Soft and nontender to palpation. No guarding or rigidity. No distention. Musculoskeletal: Full range of motion to all extremities. No obvious deformities noted.  Patient has positive straight leg raise test, left. Neurologic:  Normal for age. No gross focal neurologic deficits are appreciated.  Skin:  Skin is warm, dry and intact. No rash noted. Psychiatric: Mood and affect are normal for age. Speech and behavior are normal.   ____________________________________________   LABS (all labs ordered are listed, but only abnormal results are displayed)  Labs Reviewed - No data to display ____________________________________________  EKG   ____________________________________________  RADIOLOGY  No results found.  ____________________________________________    PROCEDURES  Procedure(s) performed:     Procedures     Medications  ketorolac (TORADOL) 30 MG/ML injection 30 mg (has no administration in time range)     ____________________________________________   INITIAL IMPRESSION / ASSESSMENT AND PLAN / ED COURSE  Pertinent labs & imaging results that were available during my care of the patient were reviewed by me and considered in my medical decision making (see chart for details).      Assessment and plan Low back pain 65 year old male presents to the emergency department with acute low back pain for the past 2 days with left lower extremity radiculopathy.  Vital signs are reassuring at triage.  On physical exam, patient was alert, active and nontoxic-appearing.  He did have a positive straight leg raise test on the left.  Patient was given IM Toradol in the emergency department.  He was discharged with meloxicam and Robaxin.  Return precautions were given to return with new or worsening symptoms.     ____________________________________________  FINAL CLINICAL IMPRESSION(S) / ED DIAGNOSES  Final diagnoses:  Lumbar radiculopathy      NEW MEDICATIONS STARTED DURING THIS VISIT:  ED Discharge Orders          Ordered    meloxicam (MOBIC) 15 MG tablet  Daily        07/22/20 1918    methocarbamol (ROBAXIN) 500 MG tablet  Every 8 hours PRN        07/22/20 1918                This chart was dictated using voice recognition software/Dragon. Despite best efforts to proofread, errors can occur which can change the meaning. Any change was purely unintentional.     07/24/20, PA-C 07/22/20 1921    07/24/20 [place], PA-C 08/13/20 1521    10/14/20, MD 09/10/20 [phone]

## 2020-07-22 NOTE — ED Notes (Signed)
Pt here for back and leg pains x 3 weeks. Pain got worse yesterday and made him come into the Er today. Pain does radiate down to his leg and foot.   EDP at bedside.

## 2020-08-12 ENCOUNTER — Other Ambulatory Visit: Payer: Self-pay

## 2020-08-12 ENCOUNTER — Emergency Department
Admission: EM | Admit: 2020-08-12 | Discharge: 2020-08-12 | Disposition: A | Discharge status: Home / Self Care | Payer: Self-pay | Attending: Emergency Medicine | Admitting: Emergency Medicine

## 2020-08-12 DIAGNOSIS — Z79899 Other long term (current) drug therapy: Secondary | ICD-10-CM | POA: Insufficient documentation

## 2020-08-12 DIAGNOSIS — E119 Type 2 diabetes mellitus without complications: Secondary | ICD-10-CM | POA: Insufficient documentation

## 2020-08-12 DIAGNOSIS — M5432 Sciatica, left side: Secondary | ICD-10-CM | POA: Insufficient documentation

## 2020-08-12 MED ORDER — CYCLOBENZAPRINE HCL 10 MG PO TABS: 10.0000 mg | ORAL_TABLET | Freq: Three times a day (TID) | ORAL | 0 refills | Status: AC | PRN

## 2020-08-12 MED ORDER — HYDROCODONE-ACETAMINOPHEN 5-325 MG PO TABS: 1.0000 | ORAL_TABLET | Freq: Four times a day (QID) | ORAL | 0 refills | Status: AC | PRN

## 2020-08-12 MED ORDER — PREDNISONE 10 MG (21) PO TBPK: ORAL_TABLET | ORAL | 0 refills | Status: AC

## 2020-08-12 MED ORDER — PREDNISONE 20 MG PO TABS
60.0000 mg | ORAL_TABLET | Freq: Once | ORAL | Status: AC
  Administered 2020-08-12: 60 mg via ORAL
  Filled 2020-08-12: qty 3

## 2020-08-12 NOTE — ED Triage Notes (Signed)
Pt states that he is having L leg pain- pt states he was seen here a month ago for something similar but that it just started back yesterday- pt denies any recent injury

## 2020-08-12 NOTE — ED Provider Notes (Signed)
[facility] Emergency Department Provider Note ____________________________________________  Time seen: Approximately 5:03 PM  I have reviewed the triage vital signs and the nursing notes.   HISTORY  Chief Complaint Leg Pain    HPI Michael Huynh is a 65 y.o. male who presents to the emergency department for evaluation and treatment of left lower extremity pain.  He states that he has had similar pain in the past.  This episode started yesterday.  Pain is in the right lower back radiates down through the buttock and down towards the ankle and foot.  Pain is worse with movement and ambulation.  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 No pertinent surgical history.  Prior to Admission medications   Medication Sig Start Date End Date Taking? Authorizing Provider  cyclobenzaprine (FLEXERIL) 10 MG tablet Take 1 tablet (10 mg total) by mouth 3 (three) times daily as needed. 08/12/20  Yes Reilyn Nelson B, FNP  HYDROcodone-acetaminophen (NORCO/VICODIN) 5-325 MG tablet Take 1 tablet by mouth every 6 (six) hours as needed for up to 3 days for severe pain. 08/12/20 08/15/20 Yes Jontae Adebayo B, FNP  predniSONE (STERAPRED UNI-PAK 21 TAB) 10 MG (21) TBPK tablet Take 6 tablets on the first day and decrease by 1 tablet each day until finished. 08/12/20  Yes Waunetta Riggle B, FNP  albuterol (PROVENTIL HFA;VENTOLIN HFA) 108 (90 Base) MCG/ACT inhaler Inhale 2 puffs into the lungs every 6 (six) hours as needed for wheezing or shortness of breath. 04/07/18   Willy Eddy, MD  amoxicillin (AMOXIL) 500 MG capsule Take 1 capsule (500 mg total) by mouth 3 (three) times daily. 12/20/17   Menshew, Charlesetta Ivory, PA-C  clindamycin (CLEOCIN) 300 MG capsule Take 1 capsule (300 mg total)  by mouth 4 (four) times daily. 09/15/19   Cuthriell, Delorise Royals, PA-C  doxycycline (VIBRAMYCIN) 100 MG capsule Take 1 capsule (100 mg) by mouth twice daily for 10 days. 01/08/18   Loleta Rose, MD  ranitidine (ZANTAC) 150 MG tablet Take 1 tablet (150 mg total) by mouth 2 (two) times daily. 12/20/17   Menshew, Charlesetta Ivory, PA-C  sulfamethoxazole-trimethoprim (BACTRIM DS) 800-160 MG tablet Take 1 tablet by mouth 2 (two) times daily. 09/15/19   Cuthriell, Delorise Royals, PA-C    Allergies Patient has no known allergies.  No family history on file.  Social History Social History   Tobacco Use   Smoking status: Never   Smokeless tobacco: Never  Substance Use Topics   Alcohol use: Not Currently   Drug use: Never    Review of Systems Constitutional: Negative for fever. Cardiovascular: Negative for chest pain. Respiratory: Negative for shortness of breath. Musculoskeletal: Positive for left lower back pain with radiation into the left leg and foot Skin: Negative for open wounds or lesions. Neurological: Negative for decrease in sensation  ____________________________________________   PHYSICAL EXAM:  VITAL SIGNS: ED Triage Vitals  Enc Vitals Group     BP 08/12/20 1530 (!) 145/60     Pulse Rate 08/12/20 1530 62     Resp 08/12/20 1530 16     Temp 08/12/20 1530 97.6 F (36.4 C)     Temp Source 08/12/20 1530 Oral     SpO2 08/12/20 1530 100 %     Weight 08/12/20  1531 136 lb 11 oz (62 kg)     Height 08/12/20 1531 5\' 4"  (1.626 m)     Head Circumference --      Peak Flow --      Pain Score 08/12/20 1531 8     Pain Loc --      Pain Edu? --      Excl. in GC? --     Constitutional: Alert and oriented. Well appearing and in no acute distress. Eyes: Conjunctivae are clear without discharge or drainage Head: Atraumatic Neck: Supple.  No focal midline tenderness. Respiratory: No cough. Respirations are even and unlabored. Musculoskeletal: Patient able to demonstrate range of motion  of the left lower extremity.  Straight leg raise is positive at about 40 degrees. Neurologic: Radicular pain from the left lumbar area down the posterior aspect of the thigh and the lateral aspect of the left calf into the foot.  Patient has 5 out of 5 strength and sensation is intact. Skin: No skin lesions noted Psychiatric: Affect and behavior are appropriate.  ____________________________________________   LABS (all labs ordered are listed, but only abnormal results are displayed)  Labs Reviewed - No data to display ____________________________________________  RADIOLOGY  Not indicated  I, 10/13/20, personally viewed and evaluated these images (plain radiographs) as part of my medical decision making, as well as reviewing the written report by the radiologist.  No results found. ____________________________________________   PROCEDURES  Procedures  ____________________________________________   INITIAL IMPRESSION / ASSESSMENT AND PLAN / ED COURSE  Michael Huynh is a 65 y.o. who presents to the emergency department for atraumatic radicular back pain.  See HPI for further details.  Plan will be to treat him with prednisone, Flexeril, and Norco.  He was advised that if he is still having pain after 1 week that he should follow-up with orthopedics.  He was also advised that if pain changes or worsens if he experiences any new concerns and is unable to see primary care or the specialist that he should return to the emergency department.  Medications  predniSONE (DELTASONE) tablet 60 mg (60 mg Oral Given 08/12/20 1818)    Pertinent labs & imaging results that were available during my care of the patient were reviewed by me and considered in my medical decision making (see chart for details).   _________________________________________   FINAL CLINICAL IMPRESSION(S) / ED DIAGNOSES  Final diagnoses:  Sciatica of left side    ED Discharge Orders          Ordered     predniSONE (STERAPRED UNI-PAK 21 TAB) 10 MG (21) TBPK tablet        08/12/20 1806    cyclobenzaprine (FLEXERIL) 10 MG tablet  3 times daily PRN        08/12/20 1806    HYDROcodone-acetaminophen (NORCO/VICODIN) 5-325 MG tablet  Every 6 hours PRN        08/12/20 1806             If controlled substance prescribed during this visit, 12 month history viewed on the [facility] prior to issuing an initial prescription for Schedule II or III opiod.    10/13/20, FNP 08/12/20 10/13/20, MD 08/13/20 0002

## 2020-08-12 NOTE — ED Notes (Signed)
Pt calm , collective, ambulatory upon discharge 

## 2020-08-28 ENCOUNTER — Encounter: Payer: Self-pay | Admitting: *Deleted

## 2020-08-28 ENCOUNTER — Other Ambulatory Visit: Payer: Self-pay

## 2020-08-28 ENCOUNTER — Emergency Department
Admission: EM | Admit: 2020-08-28 | Discharge: 2020-08-29 | Disposition: A | Discharge status: Home / Self Care | Payer: Self-pay | Attending: Emergency Medicine | Admitting: Emergency Medicine

## 2020-08-28 DIAGNOSIS — E119 Type 2 diabetes mellitus without complications: Secondary | ICD-10-CM | POA: Insufficient documentation

## 2020-08-28 DIAGNOSIS — K5900 Constipation, unspecified: Secondary | ICD-10-CM | POA: Insufficient documentation

## 2020-08-28 DIAGNOSIS — N179 Acute kidney failure, unspecified: Secondary | ICD-10-CM | POA: Insufficient documentation

## 2020-08-28 DIAGNOSIS — E860 Dehydration: Secondary | ICD-10-CM | POA: Insufficient documentation

## 2020-08-28 DIAGNOSIS — R112 Nausea with vomiting, unspecified: Secondary | ICD-10-CM | POA: Insufficient documentation

## 2020-08-28 LAB — LIPASE, BLOOD: Lipase: 37 U/L (ref 11–51)

## 2020-08-28 LAB — CBC
HCT: 36.1 % — ABNORMAL LOW (ref 39.0–52.0)
Hemoglobin: 12.4 g/dL — ABNORMAL LOW (ref 13.0–17.0)
MCH: 30.8 pg (ref 26.0–34.0)
MCHC: 34.3 g/dL (ref 30.0–36.0)
MCV: 89.8 fL (ref 80.0–100.0)
Platelets: 252 10*3/uL (ref 150–400)
RBC: 4.02 MIL/uL — ABNORMAL LOW (ref 4.22–5.81)
RDW: 12.7 % (ref 11.5–15.5)
WBC: 8 10*3/uL (ref 4.0–10.5)
nRBC: 0 % (ref 0.0–0.2)

## 2020-08-28 LAB — URINALYSIS, COMPLETE (UACMP) WITH MICROSCOPIC
Bacteria, UA: NONE SEEN
Bilirubin Urine: NEGATIVE
Glucose, UA: >=500 mg/dL — AB
Ketones, ur: NEGATIVE mg/dL
Leukocytes,Ua: NEGATIVE
Nitrite: NEGATIVE
Protein, ur: 100 mg/dL — AB
Specific Gravity, Urine: 1.02 (ref 1.005–1.030)
pH: 5 (ref 5.0–8.0)

## 2020-08-28 LAB — COMPREHENSIVE METABOLIC PANEL
ALT: 20 U/L (ref 0–44)
AST: 30 U/L (ref 15–41)
Albumin: 4.3 g/dL (ref 3.5–5.0)
Alkaline Phosphatase: 85 U/L (ref 38–126)
Anion gap: 10 (ref 5–15)
BUN: 35 mg/dL — ABNORMAL HIGH (ref 8–23)
CO2: 24 mmol/L (ref 22–32)
Calcium: 9.3 mg/dL (ref 8.9–10.3)
Chloride: 98 mmol/L (ref 98–111)
Creatinine, Ser: 2.13 mg/dL — ABNORMAL HIGH (ref 0.61–1.24)
GFR, Estimated: 34 mL/min — ABNORMAL LOW (ref >60)
Glucose, Bld: 396 mg/dL — ABNORMAL HIGH (ref 70–99)
Potassium: 5 mmol/L (ref 3.5–5.1)
Sodium: 132 mmol/L — ABNORMAL LOW (ref 135–145)
Total Bilirubin: 0.8 mg/dL (ref 0.3–1.2)
Total Protein: 7.4 g/dL (ref 6.5–8.1)

## 2020-08-28 LAB — CBG MONITORING, ED: Glucose-Capillary: 370 mg/dL — ABNORMAL HIGH (ref 70–99)

## 2020-08-28 LAB — TROPONIN I (HIGH SENSITIVITY): Troponin I (High Sensitivity): 18 ng/L — ABNORMAL HIGH (ref <18)

## 2020-08-28 MED ORDER — LACTATED RINGERS IV BOLUS
1000.0000 mL | Freq: Once | INTRAVENOUS | Status: AC
  Administered 2020-08-29: 1000 mL via INTRAVENOUS

## 2020-08-28 NOTE — ED Triage Notes (Signed)
Pt has abd pain with vomiting x 2 today.  Pt reports neck pain when vomiting.  Pt alert.  Speech clear  interpreter on a stick used in triage.

## 2020-08-29 ENCOUNTER — Emergency Department: Payer: Self-pay

## 2020-08-29 LAB — BASIC METABOLIC PANEL
Anion gap: 6 (ref 5–15)
BUN: 35 mg/dL — ABNORMAL HIGH (ref 8–23)
CO2: 27 mmol/L (ref 22–32)
Calcium: 8.9 mg/dL (ref 8.9–10.3)
Chloride: 103 mmol/L (ref 98–111)
Creatinine, Ser: 1.41 mg/dL — ABNORMAL HIGH (ref 0.61–1.24)
GFR, Estimated: 55 mL/min — ABNORMAL LOW (ref >60)
Glucose, Bld: 196 mg/dL — ABNORMAL HIGH (ref 70–99)
Potassium: 4 mmol/L (ref 3.5–5.1)
Sodium: 136 mmol/L (ref 135–145)

## 2020-08-29 LAB — TROPONIN I (HIGH SENSITIVITY)
Troponin I (High Sensitivity): 17 ng/L (ref <18)
Troponin I (High Sensitivity): 17 ng/L (ref <18)

## 2020-08-29 MED ORDER — ONDANSETRON HCL 4 MG PO TABS: 4.0000 mg | ORAL_TABLET | Freq: Every day | ORAL | 1 refills | Status: AC | PRN

## 2020-08-29 MED ORDER — LACTATED RINGERS IV BOLUS
1000.0000 mL | Freq: Once | INTRAVENOUS | Status: AC
  Administered 2020-08-29: 1000 mL via INTRAVENOUS

## 2020-08-29 NOTE — ED Notes (Signed)
Pt alert and oriented, verbalized understanding of discharge instructions. 

## 2020-08-29 NOTE — ED Notes (Signed)
Pt resting no complaints at this time. 

## 2020-08-29 NOTE — ED Notes (Signed)
Took over care of patient was having abdominal pain, now said it is not so bad.

## 2020-08-29 NOTE — Discharge Instructions (Addendum)
As we discussed, your evaluation was generally reassuring except that your vomiting has made you dehydrated.  Your labs look better after fluids, but it is important that you try to rehydrate with water or a low calorie/low sugar electrolyte solution like Gatorade.  Additionally, your x-ray shows that you are constipated, which could lead to the nausea and vomiting experience.  We recommend you try 1 or more of the over-the-counter medications listed below to help you have a bowel movement:   1)  Miralax (powder):  This medication works by drawing additional fluid into your intestines and helps to flush out your stool.  Mix the powder with water or juice according to label instructions.  Be sure to use the recommended amount of water or juice when you mix up the powder.  Plenty of fluids will help to prevent constipation. 2)  Colace (or Dulcolax) 100 mg:  This is a stool softener, and you may take it once or twice a day as needed. 3)  Senna tablets:  This is a bowel stimulant that will help "push" out your stool. It is the next step to add after you have tried a stool softener.  If the three options above are not working, even when you use them together, look for magnesium citrate at the pharmacy (it is usually in a small glass bottle).  Drink the bottle according to the label instructions.  You may also want to consider using glycerin suppositories, which you insert into your rectum.  You hold it in place and is dissolves and softens your stool and stimulates your bowels.  You could also consider using an enema, which is also available over the counter.  Remember that narcotic pain medications are constipating, so avoid them or minimize their use.  Drink plenty of fluids.  Please return to the Emergency Department immediately if you develop new or worsening symptoms that concern you, such as (but not limited to) fever > 101 degrees, severe abdominal pain, or persistent vomiting.

## 2020-08-29 NOTE — ED Provider Notes (Signed)
[facility] Emergency Department Provider Note  ____________________________________________   Event Date/Time   First MD Initiated Contact with Patient 08/28/20 2348     (approximate)  I have reviewed the triage vital signs and the nursing notes.   HISTORY  Chief Complaint Abdominal Pain and Emesis  The patient and/or family speak(s) Spanish.  They understand they have the right to the use of a hospital interpreter, however at this time they prefer to speak directly with me in Spanish.  They know that they can ask for an interpreter at any time.   HPI Michael Huynh is a 65 y.o. male with diabetes but no other chronic medical issues who presents for evaluation of cute onset and severe vomiting all during the day today.  It started around 11 AM and has been on and off all day.  He said he has not been able to eat or drink very much because of the vomiting.  He has had no pain except that the back of his neck started to hurt because he was vomiting so much but it does not hurt currently.  His nausea has also gone away.  Nothing in particular seem to make it better and his symptoms were worse earlier if he tried to eat or drink anything.  Another family member has been vomiting recently as well.  He denies fever, sore throat, chest pain, shortness of breath, cough, and abdominal pain.     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  Prior to Admission medications   Medication Sig Start Date End Date Taking? Authorizing Provider  ondansetron (ZOFRAN) 4 MG tablet Take 1 tablet (4 mg total) by mouth daily as needed for nausea or vomiting. 08/29/20 08/29/21 Yes Loleta RoseForbach, Bronda Alfred, MD  albuterol (PROVENTIL HFA;VENTOLIN HFA) 108 (90 Base) MCG/ACT inhaler Inhale 2 puffs into the lungs every  6 (six) hours as needed for wheezing or shortness of breath. 04/07/18   Willy Eddyobinson, Patrick, MD  amoxicillin (AMOXIL) 500 MG capsule Take 1 capsule (500 mg total) by mouth 3 (three) times daily. 12/20/17   Menshew, Charlesetta IvoryJenise V Bacon, PA-C  clindamycin (CLEOCIN) 300 MG capsule Take 1 capsule (300 mg total) by mouth 4 (four) times daily. 09/15/19   Cuthriell, Delorise RoyalsJonathan D, PA-C  cyclobenzaprine (FLEXERIL) 10 MG tablet Take 1 tablet (10 mg total) by mouth 3 (three) times daily as needed. 08/12/20   Triplett, Rulon Eisenmengerari B, FNP  doxycycline (VIBRAMYCIN) 100 MG capsule Take 1 capsule (100 mg) by mouth twice daily for 10 days. 01/08/18   Loleta RoseForbach, Allyiah Gartner, MD  predniSONE (STERAPRED UNI-PAK 21 TAB) 10 MG (21) TBPK tablet Take 6 tablets on the first day and decrease by 1 tablet each day until finished. 08/12/20   Triplett, Rulon Eisenmengerari B, FNP  ranitidine (ZANTAC) 150 MG tablet Take 1 tablet (150 mg total) by mouth 2 (two) times daily. 12/20/17   Menshew, Charlesetta IvoryJenise V Bacon, PA-C  sulfamethoxazole-trimethoprim (BACTRIM DS) 800-160 MG tablet Take 1 tablet by mouth 2 (two) times daily. 09/15/19   Cuthriell, Delorise RoyalsJonathan D, PA-C    Allergies Patient has no known allergies.  No family history on file.  Social History Social History   Tobacco Use   Smoking status: Never   Smokeless tobacco: Never  Substance Use Topics   Alcohol use: Not  Currently   Drug use: Never    Review of Systems Constitutional: No fever/chills Eyes: No visual changes. ENT: No sore throat. Cardiovascular: Denies chest pain. Respiratory: Denies shortness of breath. Gastrointestinal: Positive for nausea and vomiting, now the nausea has resolved.  Negative for abdominal pain. Genitourinary: Negative for dysuria. Musculoskeletal: Negative for neck pain.  Negative for back pain. Integumentary: Negative for rash. Neurological: Negative for headaches, focal weakness or numbness.   ____________________________________________   PHYSICAL EXAM:  VITAL SIGNS: ED  Triage Vitals  Enc Vitals Group     BP 08/28/20 2036 127/70     Pulse Rate 08/28/20 2036 91     Resp 08/28/20 2036 18     Temp 08/28/20 2036 98.5 F (36.9 C)     Temp Source 08/28/20 2036 Oral     SpO2 08/28/20 2036 100 %     Weight 08/28/20 2037 61.7 kg (136 lb)     Height 08/28/20 2037 1.626 m (5\' 4" )     Head Circumference --      Peak Flow --      Pain Score 08/28/20 2037 0     Pain Loc --      Pain Edu? --      Excl. in GC? --     Constitutional: Alert and oriented.  Eyes: Conjunctivae are normal.  Head: Atraumatic. Nose: No congestion/rhinnorhea. Mouth/Throat: Patient is wearing a mask. Neck: No stridor.  No meningeal signs.   Cardiovascular: Normal rate, regular rhythm. Good peripheral circulation. Respiratory: Normal respiratory effort.  No retractions. Gastrointestinal: Soft and nontender. No distention.  Musculoskeletal: No lower extremity tenderness nor edema. No gross deformities of extremities. Neurologic:  Normal speech and language. No gross focal neurologic deficits are appreciated.  Skin:  Skin is warm, dry and intact. Psychiatric: Mood and affect are normal. Speech and behavior are normal.  ____________________________________________   LABS (all labs ordered are listed, but only abnormal results are displayed)  Labs Reviewed  COMPREHENSIVE METABOLIC PANEL - Abnormal; Notable for the following components:      Result Value   Sodium 132 (*)    Glucose, Bld 396 (*)    BUN 35 (*)    Creatinine, Ser 2.13 (*)    GFR, Estimated 34 (*)    All other components within normal limits  CBC - Abnormal; Notable for the following components:   RBC 4.02 (*)    Hemoglobin 12.4 (*)    HCT 36.1 (*)    All other components within normal limits  URINALYSIS, COMPLETE (UACMP) WITH MICROSCOPIC - Abnormal; Notable for the following components:   Color, Urine YELLOW (*)    APPearance CLOUDY (*)    Glucose, UA >=500 (*)    Hgb urine dipstick MODERATE (*)    Protein, ur  100 (*)    All other components within normal limits  BASIC METABOLIC PANEL - Abnormal; Notable for the following components:   Glucose, Bld 196 (*)    BUN 35 (*)    Creatinine, Ser 1.41 (*)    GFR, Estimated 55 (*)    All other components within normal limits  CBG MONITORING, ED - Abnormal; Notable for the following components:   Glucose-Capillary 370 (*)    All other components within normal limits  TROPONIN I (HIGH SENSITIVITY) - Abnormal; Notable for the following components:   Troponin I (High Sensitivity) 18 (*)    All other components within normal limits  LIPASE, BLOOD  TROPONIN I (HIGH SENSITIVITY)  TROPONIN I (HIGH  SENSITIVITY)   ____________________________________________  EKG  ED ECG REPORT I, Loleta Rose, the attending physician, personally viewed and interpreted this ECG.  Date: 08/28/2020 EKG Time: 21: 37 Rate: 76 Rhythm: normal sinus rhythm QRS Axis: normal Intervals: Incomplete right bundle branch block ST/T Wave abnormalities: Non-specific ST segment / T-wave changes, but no clear evidence of acute ischemia. Narrative Interpretation: no definitive evidence of acute ischemia; does not meet STEMI criteria.  ____________________________________________  RADIOLOGY I, Loleta Rose, personally viewed and evaluated these images (plain radiographs) as part of my medical decision making, as well as reviewing the written report by the radiologist.  ED MD interpretation: Large stool burden without any other obvious abnormalities  Official radiology report(s): DG Abdomen Acute W/Chest  Result Date: 08/29/2020 CLINICAL DATA:  Abdominal pain and vomiting x2 days. EXAM: DG ABDOMEN ACUTE WITH 1 VIEW CHEST COMPARISON:  None. FINDINGS: There is no evidence of dilated bowel loops or free intraperitoneal air. A large amount of stool is seen throughout the colon. No radiopaque calculi or other significant radiographic abnormality is seen. Heart size and mediastinal contours  are within normal limits. Both lungs are clear. IMPRESSION: 1. Large stool burden without evidence of bowel obstruction. 2. No acute cardiopulmonary disease. Electronically Signed   By: Aram Candela M.D.   On: 08/29/2020 02:21    ____________________________________________    INITIAL IMPRESSION / MDM / ASSESSMENT AND PLAN / ED COURSE  As part of my medical decision making, I reviewed the following data within the electronic MEDICAL RECORD NUMBER Nursing notes reviewed and incorporated, Labs reviewed , Old chart reviewed, Radiograph reviewed , and Notes from prior ED visits   Differential diagnosis includes, but is not limited to, viral gastritis, nonspecific gastritis, SBO/ileus, electrolyte or metabolic abnormality.  Patient is actually quite well-appearing and in no distress with the resolution of his symptoms.  He has no tenderness to palpation of his abdomen, even deep palpation of the epigastrium and right upper quadrant.  His vital signs are stable and within normal limits.  He does not look particularly dehydrated from a clinical perspective, but unfortunately his creatinine is elevated at 2.13 and typically he has a normal creatinine.  His troponin is elevated slightly at 18 but this is likely related to his acute kidney injury and a repeat troponin is pending.  His urinalysis is unremarkable other than glucosuria and his CBC is essentially normal.  When I entered the room, I had the preconceived notion that I would likely obtain a CT scan of the abdomen and pelvis based on his symptoms and his age, but he is very well-appearing and in no distress with resolution of his vomiting and no tenderness to palpation.  I will obtain an acute abdomen series of x-rays to look for any suggestion of air-fluid levels concerning for SBO or ileus, but at this point I do not believe he needs a CT scan.  Given his overall well appearance, I talked with him about it, and we agreed to recheck some labs after 2  L of lactated Ringer's.  If he is sufficiently hydrated with no recurrence of symptoms, I believe he will be appropriate for discharge.  He understands and agrees with the plan.     Clinical Course as of 08/29/20 0343  Wed Aug 29, 2020  0226 DG Abdomen Acute W/Chest I personally reviewed the patient's imaging and agree with the radiologist's interpretation that there is no evidence of any acute abnormality other than a large stool burden [CF]  0319 Marked improvement of Cr, down to 1.4, and improvement of hyperglycemia as well.  Troponin stable.   [CF]  [phone] The patient feels much better.  He has been able to get some sleep and has had no additional vomiting.  No abdominal pain.  As previously described, his labs have improved significantly.  His wife is now at bedside.  I talked about all the results and the recommendations and they are comfortable with him going home.  I encouraged both of them that he drink plenty of fluids to stay hydrated and I gave my usual and customary return precautions.  They understand and agree with the plan. [CF]    Clinical Course User Index [CF] Loleta Rose, MD     ____________________________________________  FINAL CLINICAL IMPRESSION(S) / ED DIAGNOSES  Final diagnoses:  Non-intractable vomiting with nausea, unspecified vomiting type  Acute kidney injury (HCC)  Dehydration  Constipation, unspecified constipation type     MEDICATIONS GIVEN DURING THIS VISIT:  Medications  lactated ringers bolus 1,000 mL (0 mLs Intravenous Stopped 08/29/20 0206)  lactated ringers bolus 1,000 mL (0 mLs Intravenous Stopped 08/29/20 0207)     ED Discharge Orders          Ordered    ondansetron (ZOFRAN) 4 MG tablet  Daily PRN        08/29/20 0342             Note:  This document was prepared using Dragon voice recognition software and may include unintentional dictation errors.   Loleta Rose, MD 08/29/20 [phone]

## 2022-09-14 ENCOUNTER — Encounter: Payer: Self-pay | Admitting: Emergency Medicine

## 2022-09-14 ENCOUNTER — Other Ambulatory Visit: Payer: Self-pay

## 2022-09-14 ENCOUNTER — Emergency Department
Admission: EM | Admit: 2022-09-14 | Discharge: 2022-09-14 | Disposition: A | Discharge status: Home / Self Care | Payer: Self-pay | Attending: Emergency Medicine | Admitting: Emergency Medicine

## 2022-09-14 ENCOUNTER — Emergency Department: Payer: Self-pay

## 2022-09-14 DIAGNOSIS — E119 Type 2 diabetes mellitus without complications: Secondary | ICD-10-CM | POA: Insufficient documentation

## 2022-09-14 DIAGNOSIS — R079 Chest pain, unspecified: Secondary | ICD-10-CM | POA: Insufficient documentation

## 2022-09-14 LAB — TROPONIN I (HIGH SENSITIVITY)
Troponin I (High Sensitivity): 6 ng/L (ref <18)
Troponin I (High Sensitivity): 7 ng/L (ref <18)

## 2022-09-14 LAB — CBC
HCT: 41.2 % (ref 39.0–52.0)
Hemoglobin: 13.3 g/dL (ref 13.0–17.0)
MCH: 29.2 pg (ref 26.0–34.0)
MCHC: 32.3 g/dL (ref 30.0–36.0)
MCV: 90.5 fL (ref 80.0–100.0)
Platelets: 268 10*3/uL (ref 150–400)
RBC: 4.55 MIL/uL (ref 4.22–5.81)
RDW: 13.5 % (ref 11.5–15.5)
WBC: 6 10*3/uL (ref 4.0–10.5)
nRBC: 0 % (ref 0.0–0.2)

## 2022-09-14 LAB — BASIC METABOLIC PANEL
Anion gap: 9 (ref 5–15)
BUN: 19 mg/dL (ref 8–23)
CO2: 24 mmol/L (ref 22–32)
Calcium: 9 mg/dL (ref 8.9–10.3)
Chloride: 106 mmol/L (ref 98–111)
Creatinine, Ser: 0.92 mg/dL (ref 0.61–1.24)
GFR, Estimated: >60 mL/min (ref >60)
Glucose, Bld: 229 mg/dL — ABNORMAL HIGH (ref 70–99)
Potassium: 4.6 mmol/L (ref 3.5–5.1)
Sodium: 139 mmol/L (ref 135–145)

## 2022-09-14 MED ORDER — ASPIRIN 81 MG PO CHEW
324.0000 mg | CHEWABLE_TABLET | Freq: Once | ORAL | Status: AC
  Administered 2022-09-14: 324 mg via ORAL
  Filled 2022-09-14: qty 4

## 2022-09-14 NOTE — Discharge Instructions (Signed)
  Gracias por elegirnos para el cuidado de su salud hoy!  Consulte a su mdico de atencin primaria esta semana para una cita de seguimiento.   Si tiene algn sntoma nuevo, que empeora o inesperado, llame a su mdico de inmediato o regrese al departamento de emergencias para una nueva evaluacin.  Fue un placer cuidar de usted hoy.   Daneil Dan Modesto Charon, MD

## 2022-09-14 NOTE — ED Notes (Signed)
See triage note  States he developed some left sided chest pain which started yesterday  States pain comes and goes  denies any fever or cough

## 2022-09-14 NOTE — ED Provider Notes (Signed)
[facility] Provider Note    Event Date/Time   First MD Initiated Contact with Patient 09/14/22 1445     (approximate)   History   Chest Pain   HPI  Michael Huynh is a 67 y.o. male   Past medical history of diabetes who presents emergency department with chest pain.  Started yesterday while he was cleaning his home.  Describes left-sided chest pain nonradiating.  Relieved with rest.  This morning recurred after he mowed his lawn.  Minimal now.  No associated respiratory symptoms like shortness of breath or cough.  No significant cardiac history.  External Medical Documents Reviewed: Cardiology note from October 2021 for dizziness/presyncope      Physical Exam   Triage Vital Signs: ED Triage Vitals  Encounter Vitals Group     BP 09/14/22 1419 114/61     Systolic BP Percentile --      Diastolic BP Percentile --      Pulse Rate 09/14/22 1419 70     Resp 09/14/22 1419 18     Temp 09/14/22 1419 98.4 F (36.9 C)     Temp Source 09/14/22 1419 Oral     SpO2 09/14/22 1419 97 %     Weight 09/14/22 1416 134 lb 7.7 oz (61 kg)     Height 09/14/22 1416 5\' 4"  (1.626 m)     Head Circumference --      Peak Flow --      Pain Score 09/14/22 1416 5     Pain Loc --      Pain Education --      Exclude from Growth Chart --     Most recent vital signs: Vitals:   09/14/22 1419  BP: 114/61  Pulse: 70  Resp: 18  Temp: 98.4 F (36.9 C)  SpO2: 97%    General: Awake, no distress.  CV:  Good peripheral perfusion.  Resp:  Normal effort.  Abd:  No distention.  Other:  Awake alert comfortable with normal vital signs.  Heart sounds normal lung sounds normal appears euvolemic.  Radial pulses intact and equal bilaterally.   ED Results / Procedures / Treatments   Labs (all labs ordered are listed, but only abnormal results are displayed) Labs Reviewed  BASIC METABOLIC PANEL - Abnormal; Notable for the following components:      Result Value   Glucose,  Bld 229 (*)    All other components within normal limits  CBC  TROPONIN I (HIGH SENSITIVITY)  TROPONIN I (HIGH SENSITIVITY)     I ordered and reviewed the above labs they are notable for negative troponin x 2  EKG  ED ECG REPORT I, Pilar Jarvis, the attending physician, personally viewed and interpreted this ECG.   Date: 09/14/2022  EKG Time: 1419  Rate: 70  Rhythm: sinus  Axis: nl  Intervals: rbbb  ST&T Change: no stemi    RADIOLOGY I independently reviewed and interpreted chest x-ray and I see no obvious focality or pneumothorax I also reviewed radiologist's formal read.   PROCEDURES:  Critical Care performed: No  Procedures   MEDICATIONS ORDERED IN ED: Medications  aspirin chewable tablet 324 mg (324 mg Oral Given 09/14/22 1544)     IMPRESSION / MDM / ASSESSMENT AND PLAN / ED COURSE  I reviewed the triage vital signs and the nursing notes.  Patient's presentation is most consistent with acute presentation with potential threat to life or bodily function.  Differential diagnosis includes, but is not limited to, ACS, PE, dissection, musculoskeletal pain, respiratory infection   The patient is on the cardiac monitor to evaluate for evidence of arrhythmia and/or significant heart rate changes.  MDM:    The patient's age and comorbidities including diabetes concern for ACS given his exertional chest pain.  None currently.  Given aspirin.  EKG and serial troponins negative for ACS.  Given his risk factors I will have him follow-up with cardiology referral for further testing as needed.        FINAL CLINICAL IMPRESSION(S) / ED DIAGNOSES   Final diagnoses:  Nonspecific chest pain     Rx / DC Orders   ED Discharge Orders          Ordered    Ambulatory referral to Cardiology       Comments: If you have not heard from the Cardiology office within the next 72 hours please call [phone].   09/14/22 1723              Note:  This document was prepared using Dragon voice recognition software and may include unintentional dictation errors.    Pilar Jarvis, MD 09/14/22 [phone]

## 2022-09-14 NOTE — ED Triage Notes (Signed)
Patient to ED via POV for chest pain that started yesterday. Pain on left side of chest- non radiating. Denies cardiac history.
